# Patient Record
Sex: Female | Born: 1985 | Race: White | Hispanic: No | Marital: Married | Smoking: Former smoker
Health system: Southern US, Community
[De-identification: ages and names within clinical notes are randomized; demographics above are authoritative.]

## PROBLEM LIST (undated history)

## (undated) DIAGNOSIS — F909 Attention-deficit hyperactivity disorder, unspecified type: Secondary | ICD-10-CM

## (undated) DIAGNOSIS — E039 Hypothyroidism, unspecified: Secondary | ICD-10-CM

## (undated) DIAGNOSIS — F410 Panic disorder [episodic paroxysmal anxiety] without agoraphobia: Secondary | ICD-10-CM

## (undated) DIAGNOSIS — S0300XA Dislocation of jaw, unspecified side, initial encounter: Secondary | ICD-10-CM

## (undated) DIAGNOSIS — T783XXA Angioneurotic edema, initial encounter: Secondary | ICD-10-CM

## (undated) DIAGNOSIS — L509 Urticaria, unspecified: Secondary | ICD-10-CM

## (undated) HISTORY — DX: Angioneurotic edema, initial encounter: T78.3XXA

## (undated) HISTORY — DX: Urticaria, unspecified: L50.9

## (undated) HISTORY — PX: APPENDECTOMY: SHX54

---
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
## 2013-03-17 ENCOUNTER — Encounter (HOSPITAL_COMMUNITY): Payer: Self-pay | Admitting: Pharmacy Technician

## 2013-03-17 NOTE — H&P (Signed)
NTS SOAP Note  Vital Signs:  Vitals as of: 03/17/2013: Systolic 109: Diastolic 77: Heart Rate 73: Temp 68F: Height 64ft 11in: Weight 162Lbs 0 Ounces: Pain Level 5: BMI 22.59  BMI : 22.59 kg/m2  Subjective: This 19 Years 70 Months old Female presents for of    ABDOMINAL ISSUES: ,Has had worsening upper abdominal pain, nausea, and bloating for the past four months.  Has adjusted diet, tried probiotics, but it seems to be getting worse.  Has both diarrhea and constipation.  No h/o IBS.  Mother and sister both have had their gallbladders removed.  U/S of gallbladder negative.  HIDA scan shows 44% ejection fraction, but reproducible symptoms with fatty meal.  No fever, chills, jaundice.   Review of Symptoms:  Constitutional:unremarkable   Head:unremarkable    Eyes:unremarkable   Nose/Mouth/Throat:unremarkable Cardiovascular:  unremarkable   Respiratory:unremarkable   Gastrointestin    abdominal pain,nausea,vomiting,diarrhea,constipation,excessive gas Genitourinary:unremarkable     Musculoskeletal:unremarkable   Skin:unremarkable Hematolgic/Lymphatic:unremarkable     Allergic/Immunologic:unremarkable     Past Medical History:    Reviewed   Past Medical History  Surgical History: appy, childbirth Medical Problems: none Allergies: nkda Medications: iud   Social History:Reviewed  Social History  Preferred Language: English Race:  White Ethnicity: Not Hispanic / Latino Age: 27 Years 6 Months Marital Status:  S Alcohol:  No Recreational drug(s):  No   Smoking Status: Never smoker reviewed on 03/17/2013 Functional Status reviewed on mm/dd/yyyy ------------------------------------------------ Bathing: Normal Cooking: Normal Dressing: Normal Driving: Normal Eating: Normal Managing Meds: Normal Oral Care: Normal Shopping: Normal Toileting: Normal Transferring: Normal Walking: Normal Cognitive Status reviewed on  mm/dd/yyyy ------------------------------------------------ Attention: Normal Decision Making: Normal Language: Normal Memory: Normal Motor: Normal Perception: Normal Problem Solving: Normal Visual and Spatial: Normal   Family History:  Reviewed  Family Health History Mother, Living; Healthy; healthy Father, Living; Heart attack (myocardial infarction);     Objective Information: General:  Well appearing, well nourished in no distress.   no scleral icterus Heart:  RRR, no murmur or gallop.  Normal S1, S2.  No S3, S4.  Lungs:    CTA bilaterally, no wheezes, rhonchi, rales.  Breathing unlabored. Abdomen:Soft, tender in right upper quadrant to palpation, bloated, no HSM, no masses.  Assessment:Chronic cholecystitis  Diagnosis &amp; Procedure Smart Code   Plan:Scheduled for laparoscopic cholecystectomy on 03/23/13.   Patient Education:Alternative treatments to surgery were discussed with patient (and family).  Risks and benefits  of procedure including bleeding, infection, hepatobiilary injury, and the possibility of an open procedure, and the possibility of ongoing abdominal symptoms were fully explained to the patient (and family) who gave informed consent. Patient/family questions were addressed.  Follow-up:Pending Surgery

## 2013-03-21 ENCOUNTER — Encounter (HOSPITAL_COMMUNITY)
Admission: RE | Admit: 2013-03-21 | Discharge: 2013-03-21 | Disposition: A | Discharge status: Home / Self Care | Payer: PRIVATE HEALTH INSURANCE | Source: Ambulatory Visit | Attending: General Surgery | Admitting: General Surgery

## 2013-03-21 ENCOUNTER — Encounter (HOSPITAL_COMMUNITY): Payer: Self-pay

## 2013-03-21 HISTORY — DX: Attention-deficit hyperactivity disorder, unspecified type: F90.9

## 2013-03-21 HISTORY — DX: Panic disorder (episodic paroxysmal anxiety): F41.0

## 2013-03-21 HISTORY — DX: Dislocation of jaw, unspecified side, initial encounter: S03.00XA

## 2013-03-21 LAB — CBC WITH DIFFERENTIAL/PLATELET
Basophils Absolute: 0 10*3/uL (ref 0.0–0.1)
Lymphocytes Relative: 32 % (ref 12–46)
Lymphs Abs: 1.7 10*3/uL (ref 0.7–4.0)
Neutro Abs: 3 10*3/uL (ref 1.7–7.7)
Neutrophils Relative %: 57 % (ref 43–77)
Platelets: 157 10*3/uL (ref 150–400)
RBC: 4.37 MIL/uL (ref 3.87–5.11)
RDW: 12.3 % (ref 11.5–15.5)
WBC: 5.4 10*3/uL (ref 4.0–10.5)

## 2013-03-21 LAB — HEPATIC FUNCTION PANEL
AST: 13 U/L (ref 0–37)
Albumin: 4.2 g/dL (ref 3.5–5.2)
Total Bilirubin: 0.6 mg/dL (ref 0.3–1.2)

## 2013-03-21 LAB — BASIC METABOLIC PANEL
BUN: 9 mg/dL (ref 6–23)
CO2: 29 mEq/L (ref 19–32)
Chloride: 103 mEq/L (ref 96–112)
GFR calc non Af Amer: >90 mL/min (ref >90)
Glucose, Bld: 63 mg/dL — ABNORMAL LOW (ref 70–99)
Potassium: 3.9 mEq/L (ref 3.5–5.1)

## 2013-03-21 NOTE — Patient Instructions (Addendum)
CAMESHIA CRESSMAN  03/21/2013   Your procedure is scheduled on:  03/23/2013  Report to [facility] at  830  AM.  Call this number if you have problems the morning of surgery: [phone]   Remember:   Do not eat food or drink liquids after midnight.   Take these medicines the morning of surgery with A SIP OF WATER: none   Do not wear jewelry, make-up or nail polish.  Do not wear lotions, powders, or perfumes.  Do not shave 48 hours prior to surgery. Men may shave face and neck.  Do not bring valuables to the hospital.  [facility] is not responsible  for any belongings or valuables.  Contacts, dentures or bridgework may not be worn into surgery.  Leave suitcase in the car. After surgery it may be brought to your room.  For patients admitted to the hospital, checkout time is 11:00 AM the day of discharge.   Patients discharged the day of surgery will not be allowed to drive home.  Name and phone number of your driver: family  Special Instructions: Shower using CHG 2 nights before surgery and the night before surgery.  If you shower the day of surgery use CHG.  Use special wash - you have one bottle of CHG for all showers.  You should use approximately 1/3 of the bottle for each shower.   Please read over the following fact sheets that you were given: Pain Booklet, Coughing and Deep Breathing, Surgical Site Infection Prevention, Anesthesia Post-op Instructions and Care and Recovery After Surgery Laparoscopic Cholecystectomy Laparoscopic cholecystectomy is surgery to remove the gallbladder. The gallbladder is located slightly to the right of center in the abdomen, behind the liver. It is a concentrating and storage sac for the bile produced in the liver. Bile aids in the digestion and absorption of fats. Gallbladder disease (cholecystitis) is an inflammation of your gallbladder. This condition is usually caused by a buildup of gallstones (cholelithiasis) in your gallbladder. Gallstones can  block the flow of bile, resulting in inflammation and pain. In severe cases, emergency surgery may be required. When emergency surgery is not required, you will have time to prepare for the procedure. Laparoscopic surgery is an alternative to open surgery. Laparoscopic surgery usually has a shorter recovery time. Your common bile duct may also need to be examined and explored. Your caregiver will discuss this with you if he or she feels this should be done. If stones are found in the common bile duct, they may be removed. LET YOUR CAREGIVER KNOW ABOUT:  Allergies to food or medicine.  Medicines taken, including vitamins, herbs, eyedrops, over-the-counter medicines, and creams.  Use of steroids (by mouth or creams).  Previous problems with anesthetics or numbing medicines.  History of bleeding problems or blood clots.  Previous surgery.  Other health problems, including diabetes and kidney problems.  Possibility of pregnancy, if this applies. RISKS AND COMPLICATIONS All surgery is associated with risks. Some problems that may occur following this procedure include:  Infection.  Damage to the common bile duct, nerves, arteries, veins, or other internal organs such as the stomach or intestines.  Bleeding.  A stone may remain in the common bile duct. BEFORE THE PROCEDURE  Do not take aspirin for 3 days prior to surgery or blood thinners for 1 week prior to surgery.  Do not eat or drink anything after midnight the night before surgery.  Let your caregiver know if you develop a cold  or other infectious problem prior to surgery.  You should be present 60 minutes before the procedure or as directed. PROCEDURE  You will be given medicine that makes you sleep (general anesthetic). When you are asleep, your surgeon will make several small cuts (incisions) in your abdomen. One of these incisions is used to insert a small, lighted scope (laparoscope) into the abdomen. The laparoscope helps  the surgeon see into your abdomen. Carbon dioxide gas will be pumped into your abdomen. The gas allows more room for the surgeon to perform your surgery. Other operating instruments are inserted through the other incisions. Laparoscopic procedures may not be appropriate when:  There is major scarring from previous surgery.  The gallbladder is extremely inflamed.  There are bleeding disorders or unexpected cirrhosis of the liver.  A pregnancy is near term.  Other conditions make the laparoscopic procedure impossible. If your surgeon feels it is not safe to continue with a laparoscopic procedure, he or she will perform an open abdominal procedure. In this case, the surgeon will make an incision to open the abdomen. This gives the surgeon a larger view and field to work within. This may allow the surgeon to perform procedures that sometimes cannot be performed with a laparoscope alone. Open surgery has a longer recovery time. AFTER THE PROCEDURE  You will be taken to the recovery area where a nurse will watch and check your progress.  You may be allowed to go home the same day.  Do not resume physical activities until directed by your caregiver.  You may resume a normal diet and activities as directed. Document Released: 07/14/2005 Document Revised: 10/06/2011 Document Reviewed: 12/27/2010 [facility] Patient Information 2014 [place], [place]. PATIENT INSTRUCTIONS POST-ANESTHESIA  IMMEDIATELY FOLLOWING SURGERY:  Do not drive or operate machinery for the first twenty four hours after surgery.  Do not make any important decisions for twenty four hours after surgery or while taking narcotic pain medications or sedatives.  If you develop intractable nausea and vomiting or a severe headache please notify your doctor immediately.  FOLLOW-UP:  Please make an appointment with your surgeon as instructed. You do not need to follow up with anesthesia unless specifically instructed to do so.  WOUND CARE  INSTRUCTIONS (if applicable):  Keep a dry clean dressing on the anesthesia/puncture wound site if there is drainage.  Once the wound has quit draining you may leave it open to air.  Generally you should leave the bandage intact for twenty four hours unless there is drainage.  If the epidural site drains for more than 36-48 hours please call the anesthesia department.  QUESTIONS?:  Please feel free to call your physician or the hospital operator if you have any questions, and they will be happy to assist you.

## 2013-03-23 ENCOUNTER — Encounter (HOSPITAL_COMMUNITY)
Admission: RE | Disposition: A | Discharge status: Home / Self Care | Payer: Self-pay | Source: Ambulatory Visit | Attending: General Surgery

## 2013-03-23 ENCOUNTER — Encounter (HOSPITAL_COMMUNITY): Payer: Self-pay | Admitting: Anesthesiology

## 2013-03-23 ENCOUNTER — Ambulatory Visit (HOSPITAL_COMMUNITY)
Admission: RE | Admit: 2013-03-23 | Discharge: 2013-03-23 | Disposition: A | Discharge status: Home / Self Care | Payer: PRIVATE HEALTH INSURANCE | Source: Ambulatory Visit | Attending: General Surgery | Admitting: General Surgery

## 2013-03-23 ENCOUNTER — Encounter (HOSPITAL_COMMUNITY): Payer: Self-pay | Admitting: *Deleted

## 2013-03-23 ENCOUNTER — Ambulatory Visit (HOSPITAL_COMMUNITY): Payer: PRIVATE HEALTH INSURANCE | Admitting: Anesthesiology

## 2013-03-23 DIAGNOSIS — K811 Chronic cholecystitis: Secondary | ICD-10-CM | POA: Insufficient documentation

## 2013-03-23 HISTORY — PX: CHOLECYSTECTOMY: SHX55

## 2013-03-23 SURGERY — LAPAROSCOPIC CHOLECYSTECTOMY
Anesthesia: General | Site: Abdomen | Wound class: Contaminated

## 2013-03-23 MED ORDER — MIDAZOLAM HCL 2 MG/2ML IJ SOLN
1.0000 mg | INTRAMUSCULAR | Status: DC | PRN
  Administered 2013-03-23 (×2): 2 mg via INTRAVENOUS

## 2013-03-23 MED ORDER — CHLORHEXIDINE GLUCONATE 4 % EX LIQD: 1.0000 "application " | Freq: Once | CUTANEOUS | Status: DC

## 2013-03-23 MED ORDER — LIDOCAINE HCL (PF) 1 % IJ SOLN
INTRAMUSCULAR | Status: AC
  Filled 2013-03-23: qty 5

## 2013-03-23 MED ORDER — FENTANYL CITRATE 0.05 MG/ML IJ SOLN
INTRAMUSCULAR | Status: DC | PRN
  Administered 2013-03-23: 50 ug via INTRAVENOUS
  Administered 2013-03-23 (×3): 100 ug via INTRAVENOUS

## 2013-03-23 MED ORDER — OXYCODONE-ACETAMINOPHEN 7.5-325 MG PO TABS: 1.0000 | ORAL_TABLET | ORAL | Status: AC | PRN

## 2013-03-23 MED ORDER — SODIUM CHLORIDE 0.9 % IR SOLN
Status: DC | PRN
  Administered 2013-03-23: 1000 mL

## 2013-03-23 MED ORDER — PROPOFOL 10 MG/ML IV EMUL
INTRAVENOUS | Status: AC
  Filled 2013-03-23: qty 20

## 2013-03-23 MED ORDER — FENTANYL CITRATE 0.05 MG/ML IJ SOLN
25.0000 ug | INTRAMUSCULAR | Status: DC | PRN
  Administered 2013-03-23 (×4): 50 ug via INTRAVENOUS

## 2013-03-23 MED ORDER — MIDAZOLAM HCL 2 MG/2ML IJ SOLN
INTRAMUSCULAR | Status: AC
  Filled 2013-03-23: qty 2

## 2013-03-23 MED ORDER — ONDANSETRON HCL 4 MG/2ML IJ SOLN
4.0000 mg | Freq: Once | INTRAMUSCULAR | Status: AC
  Administered 2013-03-23: 4 mg via INTRAVENOUS

## 2013-03-23 MED ORDER — NEOSTIGMINE METHYLSULFATE 1 MG/ML IJ SOLN
INTRAMUSCULAR | Status: DC | PRN
  Administered 2013-03-23: 3 mg via INTRAVENOUS

## 2013-03-23 MED ORDER — CEFAZOLIN SODIUM-DEXTROSE 2-3 GM-% IV SOLR
INTRAVENOUS | Status: AC
  Filled 2013-03-23: qty 50

## 2013-03-23 MED ORDER — HEMOSTATIC AGENTS (NO CHARGE) OPTIME
TOPICAL | Status: DC | PRN
  Administered 2013-03-23: 1 via TOPICAL

## 2013-03-23 MED ORDER — ONDANSETRON HCL 4 MG/2ML IJ SOLN
INTRAMUSCULAR | Status: DC | PRN
  Administered 2013-03-23: 4 mg via INTRAVENOUS

## 2013-03-23 MED ORDER — BUPIVACAINE HCL (PF) 0.5 % IJ SOLN
INTRAMUSCULAR | Status: DC | PRN
  Administered 2013-03-23: 9 mL

## 2013-03-23 MED ORDER — GLYCOPYRROLATE 0.2 MG/ML IJ SOLN
INTRAMUSCULAR | Status: DC | PRN
  Administered 2013-03-23: 0.4 mg via INTRAVENOUS

## 2013-03-23 MED ORDER — MIDAZOLAM HCL 5 MG/5ML IJ SOLN
INTRAMUSCULAR | Status: DC | PRN
  Administered 2013-03-23: 2 mg via INTRAVENOUS

## 2013-03-23 MED ORDER — LACTATED RINGERS IV SOLN
INTRAVENOUS | Status: DC
  Administered 2013-03-23: 1000 mL via INTRAVENOUS
  Administered 2013-03-23: 09:00:00 via INTRAVENOUS

## 2013-03-23 MED ORDER — FENTANYL CITRATE 0.05 MG/ML IJ SOLN
INTRAMUSCULAR | Status: AC
  Filled 2013-03-23: qty 2

## 2013-03-23 MED ORDER — ONDANSETRON HCL 4 MG/2ML IJ SOLN
INTRAMUSCULAR | Status: AC
  Filled 2013-03-23: qty 2

## 2013-03-23 MED ORDER — FENTANYL CITRATE 0.05 MG/ML IJ SOLN
INTRAMUSCULAR | Status: AC
  Filled 2013-03-23: qty 5

## 2013-03-23 MED ORDER — DEXAMETHASONE SODIUM PHOSPHATE 4 MG/ML IJ SOLN
4.0000 mg | Freq: Once | INTRAMUSCULAR | Status: AC
  Administered 2013-03-23: 4 mg via INTRAVENOUS

## 2013-03-23 MED ORDER — DEXAMETHASONE SODIUM PHOSPHATE 4 MG/ML IJ SOLN
INTRAMUSCULAR | Status: AC
  Filled 2013-03-23: qty 1

## 2013-03-23 MED ORDER — ONDANSETRON HCL 4 MG/2ML IJ SOLN: 4.0000 mg | Freq: Once | INTRAMUSCULAR | Status: DC | PRN

## 2013-03-23 MED ORDER — CEFAZOLIN SODIUM-DEXTROSE 2-3 GM-% IV SOLR
2.0000 g | INTRAVENOUS | Status: DC
  Administered 2013-03-23: 2 g via INTRAVENOUS

## 2013-03-23 MED ORDER — KETOROLAC TROMETHAMINE 30 MG/ML IJ SOLN
30.0000 mg | Freq: Once | INTRAMUSCULAR | Status: AC
  Administered 2013-03-23: 30 mg via INTRAVENOUS

## 2013-03-23 MED ORDER — KETOROLAC TROMETHAMINE 30 MG/ML IJ SOLN
INTRAMUSCULAR | Status: AC
  Filled 2013-03-23: qty 1

## 2013-03-23 MED ORDER — BUPIVACAINE HCL (PF) 0.5 % IJ SOLN
INTRAMUSCULAR | Status: AC
  Filled 2013-03-23: qty 30

## 2013-03-23 SURGICAL SUPPLY — 41 items
APPLIER CLIP LAPSCP 10X32 DD (CLIP) ×2 IMPLANT
BAG HAMPER (MISCELLANEOUS) ×2 IMPLANT
BAG SPEC RTRVL LRG 6X4 10 (ENDOMECHANICALS) ×1
CLOTH BEACON ORANGE TIMEOUT ST (SAFETY) ×2 IMPLANT
COVER LIGHT HANDLE STERIS (MISCELLANEOUS) ×4 IMPLANT
DECANTER SPIKE VIAL GLASS SM (MISCELLANEOUS) ×2 IMPLANT
DURAPREP 26ML APPLICATOR (WOUND CARE) ×2 IMPLANT
ELECT REM PT RETURN 9FT ADLT (ELECTROSURGICAL) ×2
ELECTRODE REM PT RTRN 9FT ADLT (ELECTROSURGICAL) ×1 IMPLANT
FILTER SMOKE EVAC LAPAROSHD (FILTER) ×2 IMPLANT
FORMALIN 10 PREFIL 120ML (MISCELLANEOUS) ×2 IMPLANT
GLOVE BIO SURGEON STRL SZ7.5 (GLOVE) ×2 IMPLANT
GLOVE BIOGEL PI IND STRL 7.0 (GLOVE) IMPLANT
GLOVE BIOGEL PI INDICATOR 7.0 (GLOVE) ×2
GLOVE ECLIPSE 6.5 STRL STRAW (GLOVE) ×2 IMPLANT
GLOVE EXAM NITRILE LRG STRL (GLOVE) ×1 IMPLANT
GLOVE SS BIOGEL STRL SZ 6.5 (GLOVE) IMPLANT
GLOVE SUPERSENSE BIOGEL SZ 6.5 (GLOVE) ×1
GOWN STRL REIN XL XLG (GOWN DISPOSABLE) ×6 IMPLANT
HEMOSTAT SNOW SURGICEL 2X4 (HEMOSTASIS) ×2 IMPLANT
INST SET LAPROSCOPIC AP (KITS) ×2 IMPLANT
KIT ROOM TURNOVER APOR (KITS) ×2 IMPLANT
MANIFOLD NEPTUNE II (INSTRUMENTS) ×2 IMPLANT
NDL INSUFFLATION 14GA 120MM (NEEDLE) ×1 IMPLANT
NEEDLE INSUFFLATION 14GA 120MM (NEEDLE) ×2 IMPLANT
NS IRRIG 1000ML POUR BTL (IV SOLUTION) ×2 IMPLANT
PACK LAP CHOLE LZT030E (CUSTOM PROCEDURE TRAY) ×2 IMPLANT
PAD ARMBOARD 7.5X6 YLW CONV (MISCELLANEOUS) ×2 IMPLANT
POUCH SPECIMEN RETRIEVAL 10MM (ENDOMECHANICALS) ×2 IMPLANT
SET BASIN LINEN APH (SET/KITS/TRAYS/PACK) ×2 IMPLANT
SLEEVE Z-THREAD 5X100MM (TROCAR) ×2 IMPLANT
SPONGE GAUZE 2X2 8PLY STRL LF (GAUZE/BANDAGES/DRESSINGS) ×8 IMPLANT
STAPLER VISISTAT (STAPLE) ×2 IMPLANT
SUT VICRYL 0 UR6 27IN ABS (SUTURE) ×2 IMPLANT
TAPE CLOTH SURG 4X10 WHT LF (GAUZE/BANDAGES/DRESSINGS) ×1 IMPLANT
TROCAR ENDO BLADELESS 11MM (ENDOMECHANICALS) ×2 IMPLANT
TROCAR XCEL UNIV SLVE 11M 100M (ENDOMECHANICALS) ×2 IMPLANT
TROCAR Z-THREAD FIOS 5X100MM (TROCAR) ×2 IMPLANT
TUBING INSUFFLATION (TUBING) ×2 IMPLANT
WARMER LAPAROSCOPE (MISCELLANEOUS) ×2 IMPLANT
YANKAUER SUCT 12FT TUBE ARGYLE (SUCTIONS) ×2 IMPLANT

## 2013-03-23 NOTE — Transfer of Care (Signed)
Immediate Anesthesia Transfer of Care Note  Patient: Kathryn Walker  Procedure(s) Performed: Procedure(s): LAPAROSCOPIC CHOLECYSTECTOMY (N/A)  Patient Location: PACU  Anesthesia Type:General  Level of Consciousness: awake and patient cooperative  Airway & Oxygen Therapy: Patient Spontanous Breathing and Patient connected to face mask oxygen  Post-op Assessment: Report given to PACU RN, Post -op Vital signs reviewed and stable and Patient moving all extremities  Post vital signs: Reviewed and stable  Complications: No apparent anesthesia complications

## 2013-03-23 NOTE — Progress Notes (Signed)
Awake. Continues moaning/groaning. C/O postop abd pain. Restless. Med as noted.

## 2013-03-23 NOTE — Progress Notes (Signed)
Rates pain 2. Wants to see Romeo Apple, her boyfriend. Wants to go home.

## 2013-03-23 NOTE — Interval H&P Note (Signed)
History and Physical Interval Note:  03/23/2013 9:26 AM  Kathryn Walker  has presented today for surgery, with the diagnosis of chronic cholycistitis  The various methods of treatment have been discussed with the patient and family. After consideration of risks, benefits and other options for treatment, the patient has consented to  Procedure(s): LAPAROSCOPIC CHOLECYSTECTOMY (N/A) as a surgical intervention .  The patient's history has been reviewed, patient examined, no change in status, stable for surgery.  I have reviewed the patient's chart and labs.  Questions were answered to the patient's satisfaction.     Franky Macho A

## 2013-03-23 NOTE — Anesthesia Preprocedure Evaluation (Signed)
Anesthesia Evaluation  Patient identified by MRN, date of birth, ID band Patient awake    Reviewed: Allergy & Precautions, H&P , NPO status , Patient's Chart, lab work & pertinent test results  Airway Mallampati: I TM Distance: >3 FB     Dental  (+) Teeth Intact   Pulmonary neg pulmonary ROS,          Cardiovascular negative cardio ROS  Rhythm:Regular Rate:Normal     Neuro/Psych PSYCHIATRIC DISORDERS (panic attacks) Anxiety    GI/Hepatic negative GI ROS,   Endo/Other    Renal/GU      Musculoskeletal   Abdominal   Peds  Hematology   Anesthesia Other Findings   Reproductive/Obstetrics                           Anesthesia Physical Anesthesia Plan  ASA: II  Anesthesia Plan: General   Post-op Pain Management:    Induction: Intravenous  Airway Management Planned: Oral ETT  Additional Equipment:   Intra-op Plan:   Post-operative Plan: Extubation in OR  Informed Consent: I have reviewed the patients History and Physical, chart, labs and discussed the procedure including the risks, benefits and alternatives for the proposed anesthesia with the patient or authorized representative who has indicated his/her understanding and acceptance.     Plan Discussed with:   Anesthesia Plan Comments:         Anesthesia Quick Evaluation

## 2013-03-23 NOTE — Op Note (Signed)
Patient:  Kathryn Walker  DOB:  1986/06/14  MRN:  [id number]   Preop Diagnosis:  Chronic cholecystitis  Postop Diagnosis:  Same  Procedure:  Laparoscopic cholecystectomy  Surgeon:  Franky Macho, M.D.  Anes:  General endotracheal  Indications:  Patient is a 27 year old white female presents with biliary colic secondary to chronic cholecystitis. Risks and benefits of the procedure including bleeding, infection, hepatobiliary injury, and the possibility of an open procedure were fully explained to the patient, who gave informed consent.  Procedure note:  The patient is placed the supine position. After induction of general endotracheal anesthesia, the abdomen was prepped and draped using usual sterile technique with DuraPrep. Surgical site confirmation was performed.  An infraumbilical incision was made down to the fascia. A Veress needle was introduced into the abdominal cavity and confirmation of placement was done using the saline drop test. The abdomen was then insufflated to 16 mm mercury pressure. An 11 mm trocar was introduced into the abdominal cavity under direct visualization without difficulty. The patient was placed in reverse Trendelenburg position and additional 11 mm trocar was placed the epigastric region and 5 mm trochars were placed in the right upper quadrant and right flank regions. The liver was inspected and noted within normal limits. The gallbladder was retracted in a dynamic fashion in order to expose the triangle of Calot. The cystic duct was first identified. Its juncture to the infundibulum was fully identified. Endoclips placed proximally and distally on the cystic duct, and the cystic duct was divided. This was likewise done to the cystic artery. The gallbladder was then freed away from the gallbladder fossa using Bovie electrocautery. The gallbladder was delivered through the epigastric trocar site using an Endo Catch bag. The gallbladder was sent to pathology further  examination. The gallbladder fossa was inspected and no abnormal bleeding or bile leakage was noted. Surgicel is placed the gallbladder fossa. All fluid and air were then evacuated from the abdominal cavity prior to removal of the trochars.  All wounds were irrigated normal saline. All wounds were injected with 0.5% Sensorcaine. The umbilical fascia as well as epigastric fascia were reapproximated using 0 Vicryl interrupted sutures. All skin incisions were closed using staples. Betadine ointment and dressed a dressings were applied.  All tape and needle counts were correct at the end of the procedure. Patient was extubated in the operating room and transferred to PACU in stable condition.  Complications:  None  EBL:  Minimal  Specimen:  Gallbladder

## 2013-03-23 NOTE — Anesthesia Procedure Notes (Signed)
Procedure Name: Intubation Date/Time: 03/23/2013 10:29 AM Performed by: Despina Hidden Pre-anesthesia Checklist: Emergency Drugs available, Suction available, Patient being monitored and Patient identified Patient Re-evaluated:Patient Re-evaluated prior to inductionOxygen Delivery Method: Circle system utilized Preoxygenation: Pre-oxygenation with 100% oxygen Ventilation: Mask ventilation without difficulty Laryngoscope Size: 3 and Mac Grade View: Grade I Tube type: Oral Tube size: 7.0 mm Number of attempts: 1 Airway Equipment and Method: Stylet Placement Confirmation: ETT inserted through vocal cords under direct vision,  positive ETCO2 and breath sounds checked- equal and bilateral Secured at: 22 cm Tube secured with: Tape Dental Injury: Teeth and Oropharynx as per pre-operative assessment

## 2013-03-23 NOTE — Anesthesia Postprocedure Evaluation (Signed)
  Anesthesia Post-op Note  Patient: Kathryn Walker  Procedure(s) Performed: Procedure(s): LAPAROSCOPIC CHOLECYSTECTOMY (N/A)  Patient Location: PACU  Anesthesia Type:General  Level of Consciousness: awake, alert , oriented and patient cooperative  Airway and Oxygen Therapy: Patient Spontanous Breathing  Post-op Pain: 3 /10, mild  Post-op Assessment: Post-op Vital signs reviewed, Patient's Cardiovascular Status Stable, Respiratory Function Stable, Patent Airway and Pain level controlled  Post-op Vital Signs: Reviewed and stable  Complications: No apparent anesthesia complications

## 2013-03-25 ENCOUNTER — Encounter (HOSPITAL_COMMUNITY): Payer: Self-pay | Admitting: General Surgery

## 2013-03-25 MED ORDER — LIDOCAINE HCL 1 % IJ SOLN
INTRAMUSCULAR | Status: DC | PRN
  Administered 2013-03-23: 50 mg via INTRADERMAL

## 2013-03-25 MED ORDER — ROCURONIUM BROMIDE 100 MG/10ML IV SOLN
INTRAVENOUS | Status: DC | PRN
  Administered 2013-03-23: 35 mg via INTRAVENOUS

## 2013-03-25 MED ORDER — PROPOFOL 10 MG/ML IV BOLUS
INTRAVENOUS | Status: DC | PRN
  Administered 2013-03-23: 160 mg via INTRAVENOUS

## 2015-05-30 ENCOUNTER — Encounter: Payer: BC Managed Care – PPO | Admitting: Neurology

## 2015-07-09 ENCOUNTER — Ambulatory Visit (INDEPENDENT_AMBULATORY_CARE_PROVIDER_SITE_OTHER): Payer: BC Managed Care – PPO | Admitting: Neurology

## 2015-07-09 ENCOUNTER — Ambulatory Visit (INDEPENDENT_AMBULATORY_CARE_PROVIDER_SITE_OTHER): Payer: Self-pay | Admitting: Neurology

## 2015-07-09 ENCOUNTER — Encounter: Payer: Self-pay | Admitting: Neurology

## 2015-07-09 DIAGNOSIS — M79641 Pain in right hand: Secondary | ICD-10-CM

## 2015-07-09 NOTE — Procedures (Signed)
     HISTORY:  Kathryn SnellHeather Walker is a 29 year old patient with a history of right hand discomfort and cold sensations associated with some numbness as well that began in August 2016. 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 The patient has been dropping things from the right hand on occasion. She denies any neck pain or pain down the right arm. She is being evaluated for possible carpal tunnel syndrome.  NERVE CONDUCTION STUDIES:  Nerve conduction studies were performed on both upper extremities. The distal motor latencies and motor amplitudes for the median and ulnar nerves were within normal limits. The F wave latencies and nerve conduction velocities for these nerves were also normal. The sensory latencies for the median and ulnar nerves were normal.   EMG STUDIES:  EMG study was performed on the right upper extremity:  The first dorsal interosseous muscle reveals 2 to 4 K units with full recruitment. No fibrillations or positive waves were noted. The abductor pollicis brevis muscle reveals 2 to 4 K units with full recruitment. No fibrillations or positive waves were noted. The extensor indicis proprius muscle reveals 1 to 3 K units with full recruitment. No fibrillations or positive waves were noted. The pronator teres muscle reveals 2 to 3 K units with full recruitment. No fibrillations or positive waves were noted. The biceps muscle reveals 1 to 2 K units with full recruitment. No fibrillations or positive waves were noted. The triceps muscle reveals 2 to 4 K units with full recruitment. No fibrillations or positive waves were noted. The anterior deltoid muscle reveals 2 to 3 K units with full recruitment. No fibrillations or positive waves were noted. The cervical paraspinal muscles were tested at 2 levels. No abnormalities of insertional activity were seen at either level tested. There was good relaxation.    IMPRESSION:  Nerve  conduction studies done on both upper extremities were within normal limits. No evidence of carpal tunnel syndrome is seen. EMG evaluation of the right upper extremity was unremarkable, without evidence of an overlying cervical radiculopathy.  Marlan Palau. Keith Teresina Bugaj MD 07/09/2015 10:57 AM  [facility] [address] [place], [place] [id number]  Phone [phone] Fax [phone]

## 2015-07-09 NOTE — Progress Notes (Signed)
Please refer to EMG and nerve conduction study procedure note. 

## 2017-06-23 DIAGNOSIS — Z369 Encounter for antenatal screening, unspecified: Secondary | ICD-10-CM | POA: Insufficient documentation

## 2017-10-05 DIAGNOSIS — O26893 Other specified pregnancy related conditions, third trimester: Secondary | ICD-10-CM | POA: Insufficient documentation

## 2019-05-13 ENCOUNTER — Other Ambulatory Visit: Payer: Self-pay

## 2019-05-13 DIAGNOSIS — Z20822 Contact with and (suspected) exposure to covid-19: Secondary | ICD-10-CM

## 2019-05-14 LAB — NOVEL CORONAVIRUS, NAA: SARS-CoV-2, NAA: NOT DETECTED

## 2019-05-20 ENCOUNTER — Other Ambulatory Visit: Payer: Self-pay

## 2019-05-20 DIAGNOSIS — Z20822 Contact with and (suspected) exposure to covid-19: Secondary | ICD-10-CM

## 2019-05-21 ENCOUNTER — Telehealth: Payer: Self-pay

## 2019-05-21 NOTE — Telephone Encounter (Signed)
Pt called for covid results and pt informed results are not back.

## 2019-05-22 LAB — NOVEL CORONAVIRUS, NAA: SARS-CoV-2, NAA: NOT DETECTED

## 2021-08-14 ENCOUNTER — Other Ambulatory Visit: Payer: Self-pay | Admitting: Nurse Practitioner

## 2021-08-14 DIAGNOSIS — Z803 Family history of malignant neoplasm of breast: Secondary | ICD-10-CM

## 2021-08-14 DIAGNOSIS — Z9189 Other specified personal risk factors, not elsewhere classified: Secondary | ICD-10-CM

## 2021-08-27 ENCOUNTER — Ambulatory Visit
Admission: RE | Admit: 2021-08-27 | Discharge: 2021-08-27 | Disposition: A | Discharge status: Home / Self Care | Payer: BC Managed Care – PPO | Source: Ambulatory Visit | Attending: Nurse Practitioner | Admitting: Nurse Practitioner

## 2021-08-27 ENCOUNTER — Other Ambulatory Visit: Payer: Self-pay

## 2021-08-27 DIAGNOSIS — Z9189 Other specified personal risk factors, not elsewhere classified: Secondary | ICD-10-CM

## 2021-08-27 DIAGNOSIS — Z803 Family history of malignant neoplasm of breast: Secondary | ICD-10-CM

## 2021-08-27 MED ORDER — GADOBUTROL 1 MMOL/ML IV SOLN
8.0000 mL | Freq: Once | INTRAVENOUS | Status: AC | PRN
  Administered 2021-08-27: 8 mL via INTRAVENOUS

## 2022-09-30 IMAGING — MR MR BREAST BILAT WO/W CM
8 of 12 series · 33 of 48 positions shown · IV contrast (8 ML GADAVIST)
Comparison: Previous exam(s).

CLINICAL DATA: High risk screening.

Strong family history of breast cancer with breast cancer risk
calculated to be 26%. / 2 aunts's dx in their 30's/ pt states she is
asymptomatic
EXAM:
BILATERAL BREAST MRI WITH AND WITHOUT CONTRAST
TECHNIQUE: Multiplanar, multisequence MR images of both breasts were obtained
prior to and following the intravenous administration of 8 ml of
Gadavist

[Series 2: t2_tirm_tra ipat (a-p) · axial · 3.0mm · 0.70mm/px · 1 of 55 slices shown]
[im 1/55]
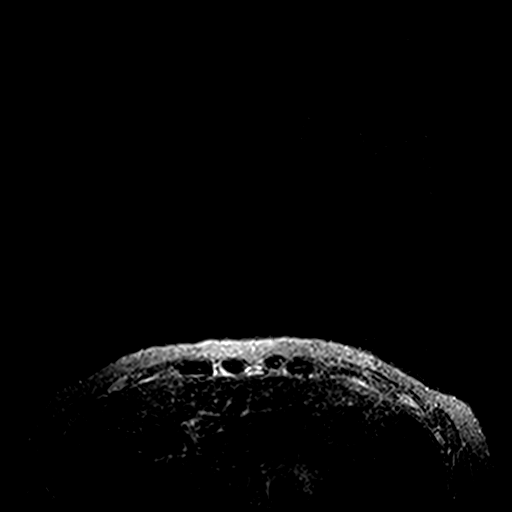

[Series 3: fl3d pre-cm no · axial · non-contrast · 1.2mm · 0.89mm/px · z∈[-69,+103]mm · 5 of 144 slices shown]
[im 1/144]
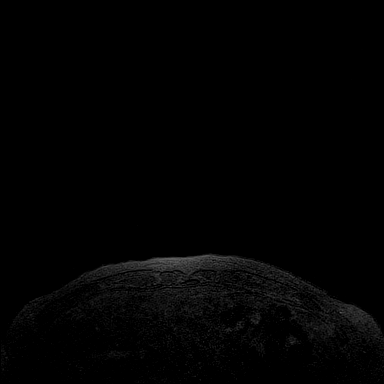
[im 36/144]
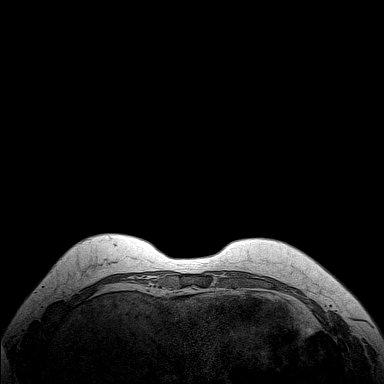
[im 72/144]
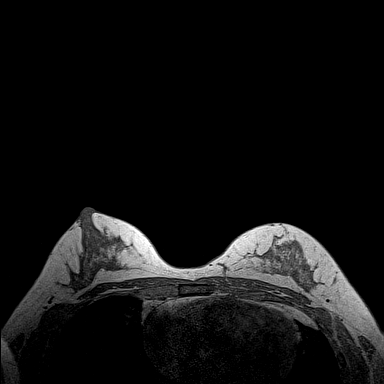
[im 108/144]
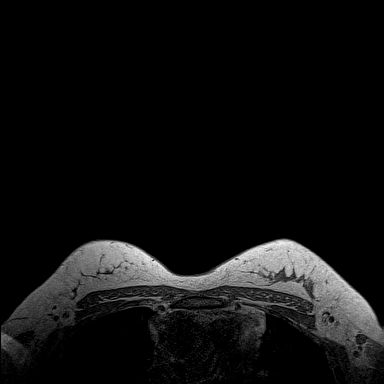
[im 144/144]
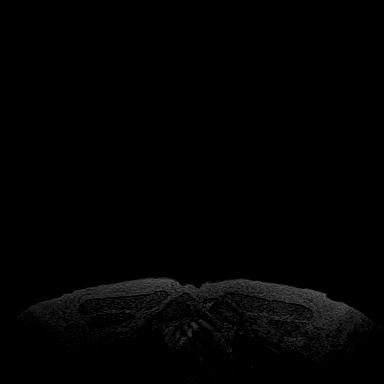

[Series 4: fl3d pre-cm · axial · non-contrast · 1.2mm · 0.89mm/px · z∈[-69,+103]mm · 5 of 144 slices shown]
[im 1/144]
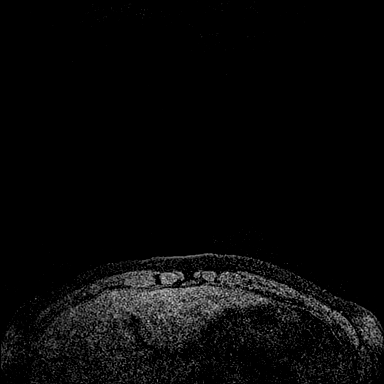
[im 36/144]
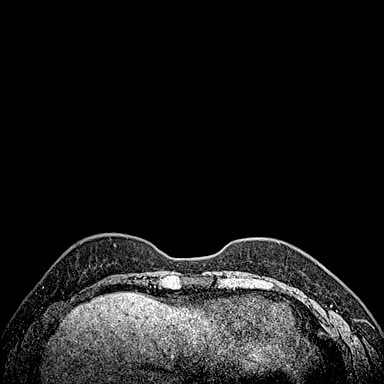
[im 72/144]
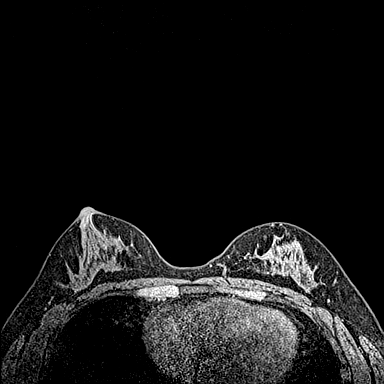
[im 108/144]
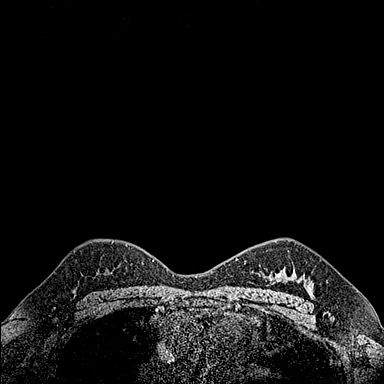
[im 144/144]
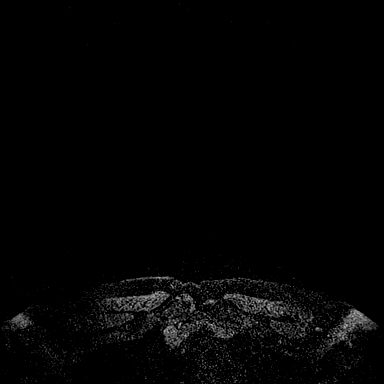

[Series 5: fl3d post-cm 20 · axial · 1.2mm · 0.89mm/px · z∈[-69,+103]mm · 5 of 144 slices shown (1 of 3)]
[im 1/144]
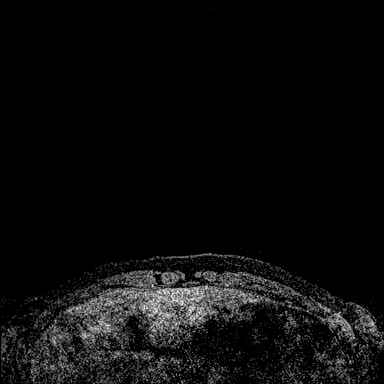
[im 36/144]
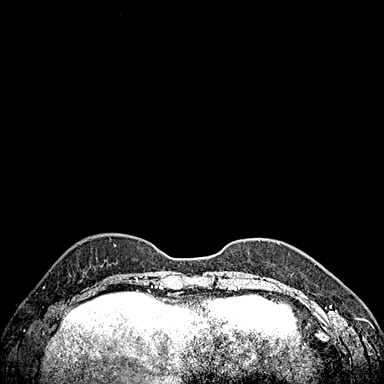
[im 72/144]
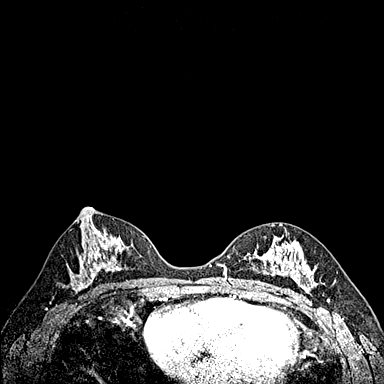
[im 108/144]
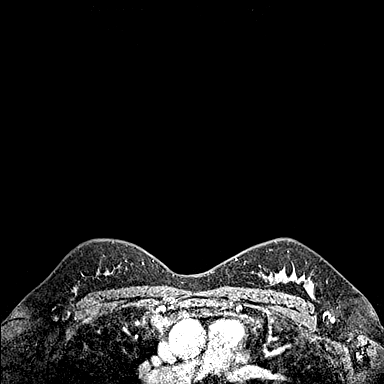
[im 144/144]
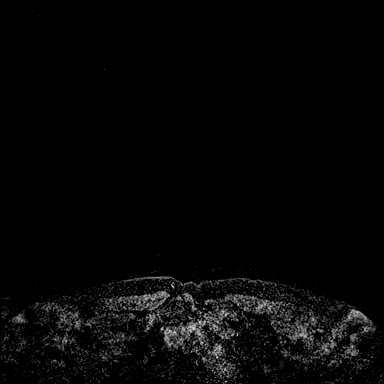

[Series 6: fl3d post-cm 20 · axial · 1.2mm · 0.89mm/px · z∈[-69,+103]mm · 5 of 144 slices shown (2 of 3)]
[im 1/144]
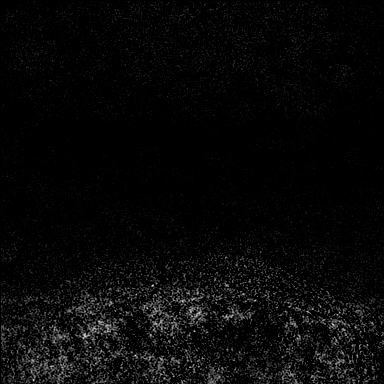
[im 36/144]
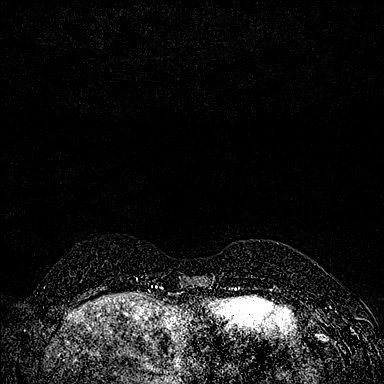
[im 72/144]
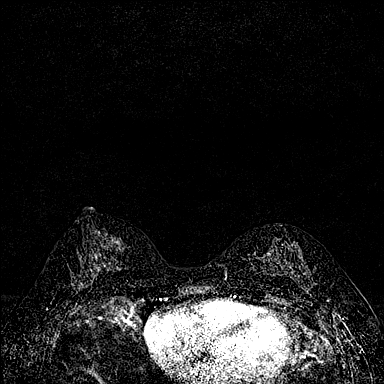
[im 108/144]
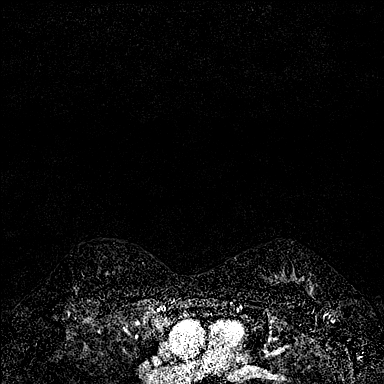
[im 144/144]
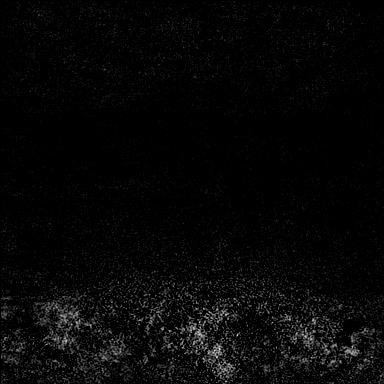

[Series 7: fl3d post-cm 20 · axial · 172.8mm · 0.89mm/px · 1 of 1 slices shown (3 of 3)]
[im 1/1]
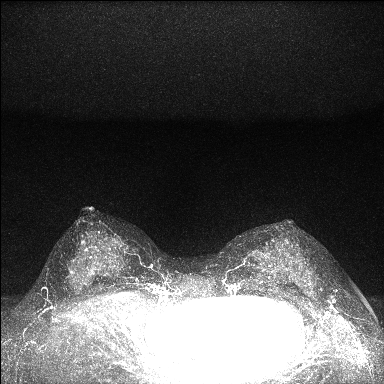

[Series 8: fl3d post-cm 3 · axial · 1.2mm · 0.89mm/px · z∈[-69,+103]mm · 6 of 144 slices shown (1 of 2)]
[im 1/144]
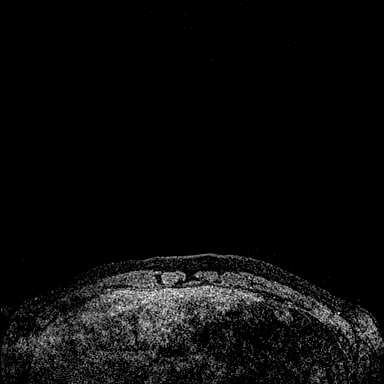
[im 29/144]
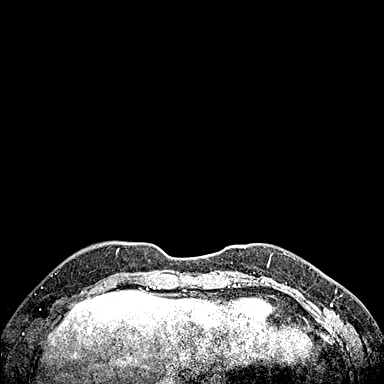
[im 58/144]
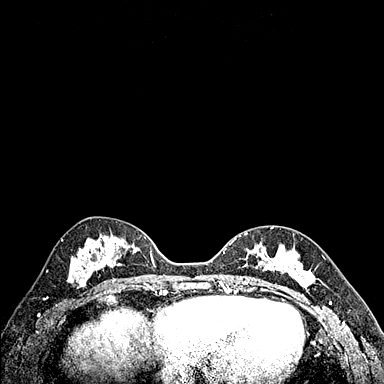
[im 86/144]
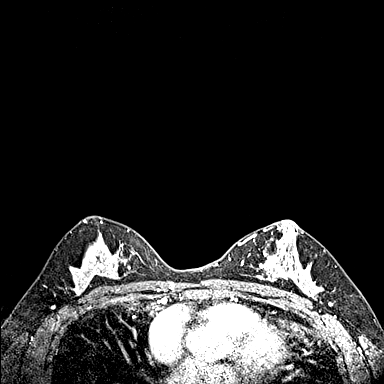
[im 115/144]
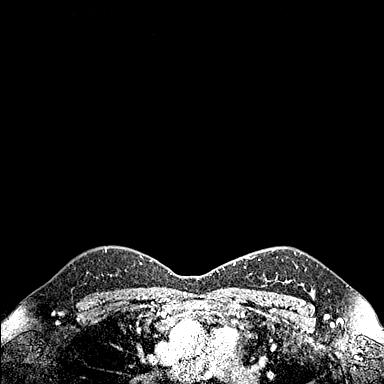
[im 144/144]
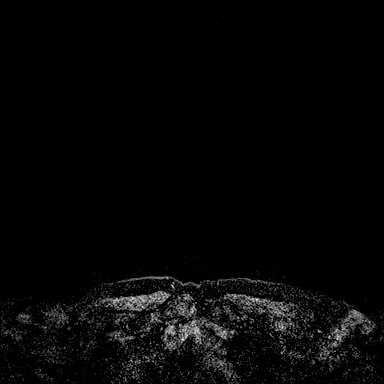

[Series 9: fl3d post-cm 3 · axial · 1.2mm · 0.89mm/px · z∈[-69,+68]mm · 5 of 144 slices shown (2 of 2)]
[im 1/144]
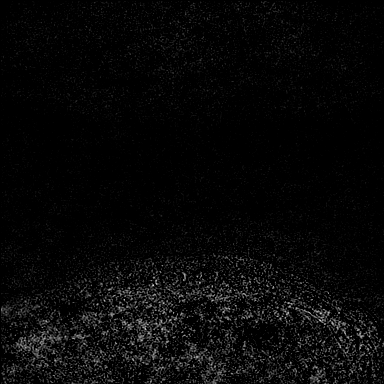
[im 29/144]
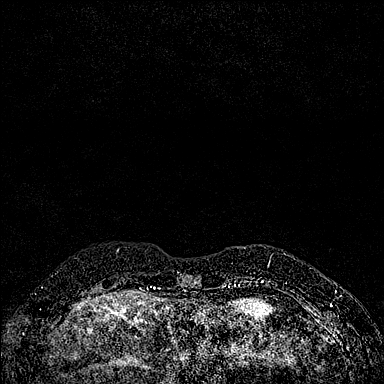
[im 58/144]
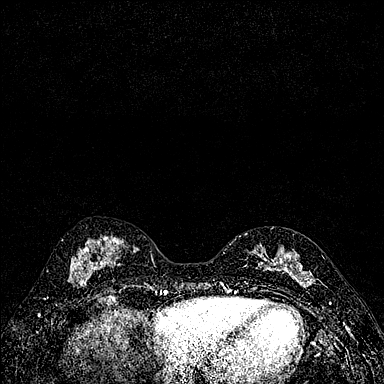
[im 86/144]
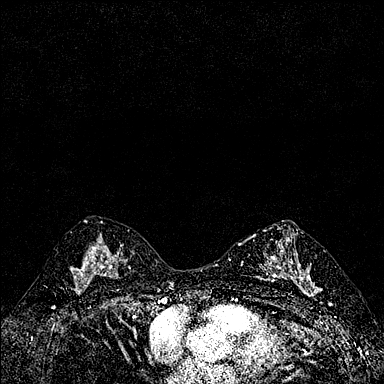
[im 115/144]
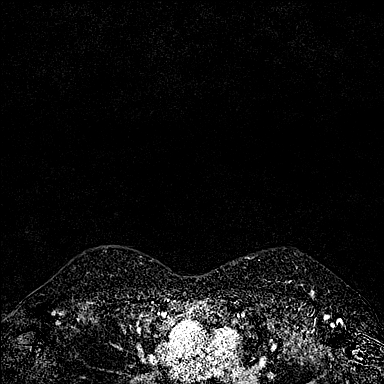

[33 of 48 positions shown; findings below may reference images not displayed]

Three-dimensional MR images were rendered by post-processing of the
original MR data on an independent workstation. The
three-dimensional MR images were interpreted, and findings are
reported in the following complete MRI report for this study. Three
dimensional images were evaluated at the independent interpreting
workstation using the DynaCAD thin client.
FINDINGS: Breast composition: c. Heterogeneous fibroglandular tissue.

Background parenchymal enhancement: Marked

Right breast: No mass or abnormal enhancement.

Left breast: No mass or abnormal enhancement.

Lymph nodes: No abnormal appearing lymph nodes.

Ancillary findings:  None.
IMPRESSION: No MRI evidence of breast carcinoma.

RECOMMENDATION:
1. Annual screening mammography.
2. Strong family history of breast carcinoma calculated lifetime
risk 26%. The American Cancer Society recommends annual supplemental
screening breast MRI, in addition to mammography, in patient's with
an estimated lifetime risk of developing breast carcinoma greater
than 20%, or who are known or suspected to be positive for the
breast cancer gene.

BI-RADS CATEGORY  1: Negative.

## 2023-01-28 ENCOUNTER — Ambulatory Visit: Payer: BC Managed Care – PPO | Admitting: Allergy & Immunology

## 2023-01-28 ENCOUNTER — Other Ambulatory Visit: Payer: Self-pay

## 2023-01-28 ENCOUNTER — Encounter: Payer: Self-pay | Admitting: Allergy & Immunology

## 2023-01-28 VITALS — BP 124/80 | HR 68 | Temp 98.2°F | Resp 18 | Ht 69.0 in | Wt 181.4 lb

## 2023-01-28 DIAGNOSIS — L508 Other urticaria: Secondary | ICD-10-CM | POA: Diagnosis not present

## 2023-01-28 NOTE — Progress Notes (Unsigned)
NEW PATIENT  Date of Service/Encounter:  01/28/23  Consult requested by: Selinda Flavin, MD   Assessment:   Acute urticaria - with non reactive skin testing today   Kindergarten teacher at [employer]  Plan/Recommendations:   1. Acute urticaria - Testing is negative to the entire environmental panel as well as most common foods. - This rules out greater than 95% of all food allergies. - Copy of testing results provided. - We are going to get some limited labs to rule out weird causes of hives. - I do not want to get too crazy and make your lab bill very high. - I do want to see you next time this happens. - Continue to note any triggers. - We will call you in 1 to 2 weeks with the results of the testing.  2. Return in about 6 months (around 07/31/2023). You can have the follow up appointment with Dr. Dellis Anes or a Nurse Practicioner (our Nurse Practitioners are excellent and always have Physician oversight!).    This note in its entirety was forwarded to the Provider who requested this consultation.  Subjective:   Kathryn Walker is a 37 y.o. female presenting today for evaluation of  Chief Complaint  Patient presents with   Urticaria    Not sure the cause - not currently using allergy medication    Allergic Reaction    [place] food causes her throat to close and lip swelling - she avoids it     Kathryn Walker has a history of the following: Patient Active Problem List   Diagnosis Date Noted   Right hand pain 07/09/2015    History obtained from: chart review and patient.  Kathryn Walker was referred by Selinda Flavin, MD.     Kathryn Walker is a 37 y.o. female presenting for an evaluation of t .  She developed hives over her arm in a couple of places. By Saturday, she had broken out over her entire body. This was Saturday April 20th. It spread to her neck and her chest. She never had fluid filled lesions. It was only red from where she touched her neck. She  was at recess when it first started. She had a yard sale on Saturday. She spend a lot of time outdoors as well. In total, the rash lasted for two weeks and then she went to the doctor finally. She got a course of prednisone to take and it all resolved. She had no other symptoms with this including stomach pain, throat irritation, or anything else.   She has reacted to [place] food 3 times in her life and she had throat irritation. This is only [place] food, but she does not do soy sauce. She does tolerate shellfish and seafood in other places. She stays away from soy and MSG.   She delayed coming in since she thought it would all get better.   Otherwise, there is no history of other atopic diseases, including asthma, drug allergies, stinging insect allergies, or contact dermatitis. There is no significant infectious history. Vaccinations are up to date.    Past Medical History: Patient Active Problem List   Diagnosis Date Noted   Right hand pain 07/09/2015    Medication List:  Allergies as of 01/28/2023   No Known Allergies      Medication List        Accurate as of January 28, 2023  4:32 PM. If you have any questions, ask your nurse or doctor.  atomoxetine 60 MG capsule Commonly known as: STRATTERA Take 60 mg by mouth daily.   escitalopram 20 MG tablet Commonly known as: LEXAPRO   Mirena (52 MG) 20 MCG/DAY Iud Generic drug: levonorgestrel TO BE INSERTED ONE TIME BY PRESCRIBER. ROUTE INTRAUTERINE.   multivitamin tablet Take 1 tablet by mouth daily.        Birth History: non-contributory  Developmental History: non-contributory  Past Surgical History: Past Surgical History:  Procedure Laterality Date   APPENDECTOMY     CHOLECYSTECTOMY N/A 03/23/2013   Procedure: LAPAROSCOPIC CHOLECYSTECTOMY;  Surgeon: Dalia Heading, MD;  Location: [facility];  Service: General;  Laterality: N/A;     Family History: History reviewed. No pertinent family  history.   Social History: Kathryn Walker lives at home with family.  They live in a house that is 94-year-old.  There is laminate throughout the home.  There is electric heating and central cooling.  There are [place] pigs inside the classroom.  She has a bunch of outdoor animals at home including chickens, horses, chickens, cats, and dogs.  There are no dust mite covers on the bedding.  There is no tobacco exposure.  She has been a Midwife for 5 years.  There is no fume, chemical, or dust exposure.  She does use a HEPA filter in her home.   Review of Systems  Constitutional: Negative.  Negative for chills, fever, malaise/fatigue and weight loss.  HENT: Negative.  Negative for congestion, ear discharge and ear pain.   Eyes:  Negative for pain, discharge and redness.  Respiratory:  Negative for cough, sputum production, shortness of breath and wheezing.   Cardiovascular: Negative.  Negative for chest pain and palpitations.  Gastrointestinal:  Negative for abdominal pain, constipation, diarrhea, heartburn, nausea and vomiting.  Skin: Negative.  Negative for itching and rash.  Neurological:  Negative for dizziness and headaches.  Endo/Heme/Allergies:  Negative for environmental allergies. Does not bruise/bleed easily.       Objective:   Blood pressure 124/80, pulse 68, temperature 98.2 F (36.8 C), resp. rate 18, height 5\' 9"  (1.753 m), weight 181 lb 6.4 oz (82.3 kg), SpO2 97 %. Body mass index is 26.79 kg/m.     Physical Exam Constitutional:      Appearance: She is well-developed.  HENT:     Head: Normocephalic and atraumatic.     Right Ear: Tympanic membrane, ear canal and external ear normal. No drainage, swelling or tenderness. Tympanic membrane is not injected, scarred, erythematous, retracted or bulging.     Left Ear: Tympanic membrane, ear canal and external ear normal. No drainage, swelling or tenderness. Tympanic membrane is not injected, scarred, erythematous,  retracted or bulging.     Nose: No nasal deformity, septal deviation, mucosal edema or rhinorrhea.     Right Sinus: No maxillary sinus tenderness or frontal sinus tenderness.     Left Sinus: No maxillary sinus tenderness or frontal sinus tenderness.     Mouth/Throat:     Mouth: Mucous membranes are not pale and not dry.     Pharynx: Uvula midline.  Eyes:     General:        Right eye: No discharge.        Left eye: No discharge.     Conjunctiva/sclera: Conjunctivae normal.     Right eye: Right conjunctiva is not injected. No chemosis.    Left eye: Left conjunctiva is not injected. No chemosis.    Pupils: Pupils are equal, round, and reactive to light.  Cardiovascular:     Rate and Rhythm: Normal rate and regular rhythm.     Heart sounds: Normal heart sounds.  Pulmonary:     Effort: Pulmonary effort is normal. No tachypnea, accessory muscle usage or respiratory distress.     Breath sounds: Normal breath sounds. No wheezing, rhonchi or rales.  Chest:     Chest wall: No tenderness.  Abdominal:     Tenderness: There is no abdominal tenderness. There is no guarding or rebound.  Lymphadenopathy:     Head:     Right side of head: No submandibular, tonsillar or occipital adenopathy.     Left side of head: No submandibular, tonsillar or occipital adenopathy.     Cervical: No cervical adenopathy.  Skin:    Coloration: Skin is not pale.     Findings: No abrasion, erythema, petechiae or rash. Rash is not papular, urticarial or vesicular.  Neurological:     Mental Status: She is alert.      Diagnostic studies:   Allergy Studies:     Airborne Adult Perc - 01/28/23 0915     Time Antigen Placed 0915    Allergen Manufacturer Waynette Buttery    Location Back    Number of Test 55    1. Control-Buffer 50% Glycerol Negative    2. Control-Histamine 3+    3. Bahia Negative    4. Mennella Southern Territories Negative    5. Johnson Negative    6. Kentucky Blue Negative    7. Meadow Fescue Negative    8. Perennial Rye  Negative    9. Timothy Negative    10. Ragweed Mix Negative    11. Cocklebur Negative    12. Plantain,  English Negative    13. Baccharis Negative    14. Dog Fennel Negative    15. Russian Thistle Negative    16. Lamb's Quarters Negative    17. Sheep Sorrell Negative    18. Rough Pigweed Negative    19. Marsh Elder, Rough Negative    20. Mugwort, Common Negative    21. Box, Elder Negative    22. Cedar, red Negative    23. Sweet Gum Negative    24. Pecan Pollen Negative    25. Pine Mix Negative    26. Walnut, Black Pollen Negative    27. Red Mulberry Negative    28. Ash Mix Negative    29. Birch Mix Negative    30. Beech American Negative    31. Cottonwood, Guinea-Bissau Negative    32. Hickory, White Negative    33. Maple Mix Negative    34. Oak, Guinea-[place] Mix Negative    35. Sycamore Eastern Negative    36. Alternaria Alternata Negative    37. Cladosporium Herbarum Negative    38. Aspergillus Mix Negative    39. Penicillium Mix Negative    40. Bipolaris Sorokiniana (Helminthosporium) Negative    41. Drechslera Spicifera (Curvularia) Negative    42. Mucor Plumbeus Negative    43. Fusarium Moniliforme Negative    44. Aureobasidium Pullulans (pullulara) Negative    45. Rhizopus Oryzae Negative    46. Botrytis Cinera Negative    47. Epicoccum Nigrum Negative    48. Phoma Betae Negative    49. Dust Mite Mix Negative    50. Cat Hair 10,000 BAU/ml Negative    51.  Dog Epithelia Negative    52. Mixed Feathers Negative    53. Horse Epithelia Negative    54. Cockroach, German Negative    55.  Tobacco Leaf Negative             Food Adult Perc - 01/28/23 0900     Time Antigen Placed 1610    Allergen Manufacturer Waynette Buttery    Location Back    Number of allergen test 17    1. Peanut Negative    2. Soybean Negative    3. Wheat Negative    4. Sesame Negative    5. Milk, Cow Negative    6. Casein Negative    7. Egg White, Chicken Negative    8. Shellfish Mix Negative    9. Fish  Mix Negative    10. Cashew Negative    11. Walnut Food Negative    12. Almond Negative    13. Hazelnut Negative    14. Pecan Food Negative    15. Pistachio Negative    16. [place] Nut Negative    17. Coconut Negative             Allergy testing results were read and interpreted by myself, documented by clinical staff.         Malachi Bonds, MD [facility]

## 2023-01-28 NOTE — Patient Instructions (Addendum)
1. Acute urticaria - Testing is negative to the entire environmental panel as well as most common foods. - This rules out greater than 95% of all food allergies. - Copy of testing results provided. - We are going to get some limited labs to rule out weird causes of hives. - I do not want to get too crazy and make your lab bill very high. - I do want to see you next time this happens. - Continue to note any triggers. - We will call you in 1 to 2 weeks with the results of the testing.  2. Return in about 6 months (around 07/31/2023). You can have the follow up appointment with Dr. Dellis Anes or a Nurse Practicioner (our Nurse Practitioners are excellent and always have Physician oversight!).    Please inform us of any Emergency Department visits, hospitalizations, or changes in symptoms. Call us before going to the ED for breathing or allergy symptoms since we might be able to fit you in for a sick visit. Feel free to contact us anytime with any questions, problems, or concerns.  It was a pleasure to meet you today!  Websites that have reliable patient information: 1. American Academy of Asthma, Allergy, and Immunology: www.aaaai.org 2. Food Allergy Research and Education (FARE): foodallergy.org 3. Mothers of Asthmatics: http://www.asthmacommunitynetwork.org 4. American College of Allergy, Asthma, and Immunology: www.acaai.org   COVID-19 Vaccine Information can be found at: [email or web address] For questions related to vaccine distribution or appointments, please email [email or web address] or call [phone].   We realize that you might be concerned about having an allergic reaction to the COVID19 vaccines. To help with that concern, WE ARE OFFERING THE COVID19 VACCINES IN OUR OFFICE! Ask the front desk for dates!     "Like" [place] on Facebook and Instagram for our latest updates!      A healthy democracy works best when [employer]  participate! Make sure you are registered to vote! If you have moved or changed any of your contact information, you will need to get this updated before voting!  In some cases, you MAY be able to register to vote online: [email or web address]       Airborne Adult Perc - 01/28/23 0915     Time Antigen Placed 0915    Allergen Manufacturer Waynette Buttery    Location Back    Number of Test 55    1. Control-Buffer 50% Glycerol Negative    2. Control-Histamine 3+    3. Bahia Negative    4. Mauch Southern Territories Negative    5. Johnson Negative    6. Kentucky Blue Negative    7. Meadow Fescue Negative    8. Perennial Rye Negative    9. Timothy Negative    10. Ragweed Mix Negative    11. Cocklebur Negative    12. Plantain,  English Negative    13. Baccharis Negative    14. Dog Fennel Negative    15. Russian Thistle Negative    16. Lamb's Quarters Negative    17. Sheep Sorrell Negative    18. Rough Pigweed Negative    19. Marsh Elder, Rough Negative    20. Mugwort, Common Negative    21. Box, Elder Negative    22. Cedar, red Negative    23. Sweet Gum Negative    24. Pecan Pollen Negative    25. Pine Mix Negative    26. Walnut, Black Pollen Negative    27. Red Mulberry Negative    28. Ash Mix Negative    29.  Birch Mix Negative    30. Beech American Negative    31. Cottonwood, Guinea-Bissau Negative    32. Hickory, White Negative    33. Maple Mix Negative    34. Oak, [place] Mix Negative    35. Sycamore Eastern Negative    36. Alternaria Alternata Negative    37. Cladosporium Herbarum Negative    38. Aspergillus Mix Negative    39. Penicillium Mix Negative    40. Bipolaris Sorokiniana (Helminthosporium) Negative    41. Drechslera Spicifera (Curvularia) Negative    42. Mucor Plumbeus Negative    43. Fusarium Moniliforme Negative    44. Aureobasidium Pullulans (pullulara) Negative    45. Rhizopus Oryzae Negative    46. Botrytis Cinera Negative    47. Epicoccum Nigrum  Negative    48. Phoma Betae Negative    49. Dust Mite Mix Negative    50. Cat Hair 10,000 BAU/ml Negative    51.  Dog Epithelia Negative    52. Mixed Feathers Negative    53. Horse Epithelia Negative    54. Cockroach, German Negative    55. Tobacco Leaf Negative             Food Adult Perc - 01/28/23 0900     Time Antigen Placed 1610    Allergen Manufacturer Waynette Buttery    Location Back    Number of allergen test 17    1. Peanut Negative    2. Soybean Negative    3. Wheat Negative    4. Sesame Negative    5. Milk, Cow Negative    6. Casein Negative    7. Egg White, Chicken Negative    8. Shellfish Mix Negative    9. Fish Mix Negative    10. Cashew Negative    11. Walnut Food Negative    12. Almond Negative    13. Hazelnut Negative    14. Pecan Food Negative    15. Pistachio Negative    16. [place] Nut Negative    17. Coconut Negative

## 2023-01-29 ENCOUNTER — Encounter: Payer: Self-pay | Admitting: Allergy & Immunology

## 2023-01-29 LAB — CBC WITH DIFFERENTIAL/PLATELET
Basophils Absolute: 0 10*3/uL (ref 0.0–0.2)
Hemoglobin: 14.3 g/dL (ref 11.1–15.9)
Neutrophils: 62 %
Platelets: 230 10*3/uL (ref 150–450)
RDW: 12.2 % (ref 11.7–15.4)

## 2023-01-30 LAB — CBC WITH DIFFERENTIAL/PLATELET
EOS (ABSOLUTE): 0 10*3/uL (ref 0.0–0.4)
Immature Grans (Abs): 0 10*3/uL (ref 0.0–0.1)
Lymphs: 31 %
Monocytes Absolute: 0.5 10*3/uL (ref 0.1–0.9)
Neutrophils Absolute: 4.7 10*3/uL (ref 1.4–7.0)

## 2023-01-30 LAB — ANTINUCLEAR ANTIBODIES, IFA

## 2023-01-30 LAB — ALPHA-GAL PANEL

## 2023-01-30 LAB — C-REACTIVE PROTEIN: CRP: <1 mg/L (ref 0–10)

## 2023-02-01 LAB — CBC WITH DIFFERENTIAL/PLATELET
Eos: 0 %
MCH: 30.2 pg (ref 26.6–33.0)
MCV: 89 fL (ref 79–97)
Monocytes: 7 %

## 2023-02-01 LAB — ALPHA-GAL PANEL: Beef IgE: <0.1 kU/L

## 2023-02-02 LAB — CBC WITH DIFFERENTIAL/PLATELET
Basos: 0 %
Hematocrit: 42.1 % (ref 34.0–46.6)
Immature Granulocytes: 0 %
Lymphocytes Absolute: 2.4 10*3/uL (ref 0.7–3.1)
MCHC: 34 g/dL (ref 31.5–35.7)
RBC: 4.74 x10E6/uL (ref 3.77–5.28)
WBC: 7.6 10*3/uL (ref 3.4–10.8)

## 2023-02-02 LAB — TRYPTASE: Tryptase: 5.7 ug/L (ref 2.2–13.2)

## 2023-02-02 LAB — FANA STAINING PATTERNS: Speckled Pattern: 1:320 {titer} — ABNORMAL HIGH

## 2023-02-02 LAB — ALPHA-GAL PANEL
Allergen Lamb IgE: <0.1 kU/L
Pork IgE: <0.1 kU/L

## 2023-02-02 LAB — SEDIMENTATION RATE: Sed Rate: 2 mm/hr (ref 0–32)

## 2023-02-10 ENCOUNTER — Encounter: Payer: Self-pay | Admitting: Allergy & Immunology

## 2023-02-19 ENCOUNTER — Other Ambulatory Visit: Payer: Self-pay | Admitting: Otolaryngology

## 2023-02-19 ENCOUNTER — Encounter: Payer: Self-pay | Admitting: Otolaryngology

## 2023-02-19 DIAGNOSIS — E041 Nontoxic single thyroid nodule: Secondary | ICD-10-CM

## 2023-03-02 ENCOUNTER — Other Ambulatory Visit: Payer: Self-pay | Admitting: Otolaryngology

## 2023-03-02 ENCOUNTER — Inpatient Hospital Stay
Admission: RE | Admit: 2023-03-02 | Discharge: 2023-03-02 | Disposition: A | Discharge status: Home / Self Care | Payer: Self-pay | Source: Ambulatory Visit | Attending: Otolaryngology | Admitting: Otolaryngology

## 2023-03-02 DIAGNOSIS — E041 Nontoxic single thyroid nodule: Secondary | ICD-10-CM

## 2023-03-09 ENCOUNTER — Ambulatory Visit (HOSPITAL_COMMUNITY)
Admission: RE | Admit: 2023-03-09 | Discharge: 2023-03-09 | Disposition: A | Discharge status: Home / Self Care | Payer: BC Managed Care – PPO | Source: Ambulatory Visit | Attending: Otolaryngology | Admitting: Otolaryngology

## 2023-03-09 ENCOUNTER — Encounter (HOSPITAL_COMMUNITY): Payer: Self-pay

## 2023-03-09 DIAGNOSIS — R896 Abnormal cytological findings in specimens from other organs, systems and tissues: Secondary | ICD-10-CM | POA: Diagnosis not present

## 2023-03-09 DIAGNOSIS — E041 Nontoxic single thyroid nodule: Secondary | ICD-10-CM | POA: Insufficient documentation

## 2023-03-09 MED ORDER — LIDOCAINE HCL (PF) 2 % IJ SOLN
INTRAMUSCULAR | Status: AC
  Filled 2023-03-09: qty 10

## 2023-03-09 MED ORDER — LIDOCAINE HCL (PF) 2 % IJ SOLN
10.0000 mL | Freq: Once | INTRAMUSCULAR | Status: AC
  Administered 2023-03-09: 10 mL

## 2023-03-09 NOTE — Progress Notes (Signed)
PT tolerated thyroid biopsy procedure well today. Labs and afirma obtained and sent for pathology. PT ambulatory at discharge with no acute distress noted and verbalized understanding of discharge instructions. 

## 2023-03-24 ENCOUNTER — Encounter (HOSPITAL_COMMUNITY): Payer: Self-pay

## 2023-05-29 ENCOUNTER — Other Ambulatory Visit: Payer: Self-pay | Admitting: Medical Genetics

## 2023-05-29 DIAGNOSIS — Z006 Encounter for examination for normal comparison and control in clinical research program: Secondary | ICD-10-CM

## 2023-06-02 ENCOUNTER — Other Ambulatory Visit (HOSPITAL_COMMUNITY)
Admission: RE | Admit: 2023-06-02 | Discharge: 2023-06-02 | Disposition: A | Discharge status: Home / Self Care | Payer: BC Managed Care – PPO | Source: Ambulatory Visit | Attending: Medical Genetics | Admitting: Medical Genetics

## 2023-06-02 DIAGNOSIS — Z006 Encounter for examination for normal comparison and control in clinical research program: Secondary | ICD-10-CM | POA: Insufficient documentation

## 2023-06-12 LAB — HELIX MOLECULAR SCREEN: Genetic Analysis Overall Interpretation: NEGATIVE

## 2023-06-12 LAB — GENECONNECT MOLECULAR SCREEN

## 2023-06-15 ENCOUNTER — Other Ambulatory Visit: Payer: Self-pay | Admitting: Otolaryngology

## 2023-07-13 NOTE — Pre-Procedure Instructions (Signed)
Surgical Instructions   Your procedure is scheduled on July 17, 2023. Report to [facility] Main Entrance "A" at 7:20 A.M., then check in with the Admitting office. Any questions or running late day of surgery: call [phone]  Questions prior to your surgery date: call [phone], Monday-Friday, 8am-4pm. If you experience any cold or flu symptoms such as cough, fever, chills, shortness of breath, etc. between now and your scheduled surgery, please notify us at the above number.     Remember:  Do not eat after midnight the night before your surgery   You may drink clear liquids until 6:20 AM the morning of your surgery.   Clear liquids allowed are: Water, Non-Citrus Juices (without pulp), Carbonated Beverages, Clear Tea (no milk, honey, etc.), Black Coffee Only (NO MILK, CREAM OR POWDERED CREAMER of any kind), and Gatorade.    Take these medicines the morning of surgery with A SIP OF WATER: escitalopram (LEXAPRO)    One week prior to surgery, STOP taking any Aspirin (unless otherwise instructed by your surgeon) Aleve, Naproxen, Ibuprofen, Motrin, Advil, Goody's, BC's, all herbal medications, fish oil, and non-prescription vitamins.                     Do NOT Smoke (Tobacco/Vaping) for 24 hours prior to your procedure.  If you use a CPAP at night, you may bring your mask/headgear for your overnight stay.   You will be asked to remove any contacts, glasses, piercing's, hearing aid's, dentures/partials prior to surgery. Please bring cases for these items if needed.    Patients discharged the day of surgery will not be allowed to drive home, and someone needs to stay with them for 24 hours.  SURGICAL WAITING ROOM VISITATION Patients may have no more than 2 support people in the waiting area - these visitors may rotate.   Pre-op nurse will coordinate an appropriate time for 1 ADULT support person, who may not rotate, to accompany patient in pre-op.  Children under the age of 7  must have an adult with them who is not the patient and must remain in the main waiting area with an adult.  If the patient needs to stay at the hospital during part of their recovery, the visitor guidelines for inpatient rooms apply.  Please refer to the [facility] website for the visitor guidelines for any additional information.   If you received a COVID test during your pre-op visit  it is requested that you wear a mask when out in public, stay away from anyone that may not be feeling well and notify your surgeon if you develop symptoms. If you have been in contact with anyone that has tested positive in the last 10 days please notify you surgeon.      Pre-operative CHG Bathing Instructions   You can play a key role in reducing the risk of infection after surgery. Your skin needs to be as free of germs as possible. You can reduce the number of germs on your skin by washing with CHG (chlorhexidine gluconate) soap before surgery. CHG is an antiseptic soap that kills germs and continues to kill germs even after washing.   DO NOT use if you have an allergy to chlorhexidine/CHG or antibacterial soaps. If your skin becomes reddened or irritated, stop using the CHG and notify one of our RNs at [phone].              TAKE A SHOWER THE NIGHT BEFORE SURGERY AND THE DAY  OF SURGERY    Please keep in mind the following:  DO NOT shave, including legs and underarms, 48 hours prior to surgery.   You may shave your face before/day of surgery.  Place clean sheets on your bed the night before surgery Use a clean washcloth (not used since being washed) for each shower. DO NOT sleep with pet's night before surgery.  CHG Shower Instructions:  Wash your face and private area with normal soap. If you choose to wash your hair, wash first with your normal shampoo.  After you use shampoo/soap, rinse your hair and body thoroughly to remove shampoo/soap residue.  Turn the water OFF and apply half the  bottle of CHG soap to a CLEAN washcloth.  Apply CHG soap ONLY FROM YOUR NECK DOWN TO YOUR TOES (washing for 3-5 minutes)  DO NOT use CHG soap on face, private areas, open wounds, or sores.  Pay special attention to the area where your surgery is being performed.  If you are having back surgery, having someone wash your back for you may be helpful. Wait 2 minutes after CHG soap is applied, then you may rinse off the CHG soap.  Pat dry with a clean towel  Put on clean pajamas    Additional instructions for the day of surgery: DO NOT APPLY any lotions, deodorants, cologne, or perfumes.   Do not wear jewelry or makeup Do not wear nail polish, gel polish, artificial nails, or any other type of covering on natural nails (fingers and toes) Do not bring valuables to the hospital. [facility] is not responsible for valuables/personal belongings. Put on clean/comfortable clothes.  Please brush your teeth.  Ask your nurse before applying any prescription medications to the skin.

## 2023-07-14 ENCOUNTER — Other Ambulatory Visit (HOSPITAL_COMMUNITY): Payer: BC Managed Care – PPO

## 2023-07-14 ENCOUNTER — Encounter (HOSPITAL_COMMUNITY)
Admission: RE | Admit: 2023-07-14 | Discharge: 2023-07-14 | Disposition: A | Discharge status: Home / Self Care | Payer: BC Managed Care – PPO | Source: Ambulatory Visit | Attending: Otolaryngology | Admitting: Otolaryngology

## 2023-07-14 ENCOUNTER — Other Ambulatory Visit: Payer: Self-pay

## 2023-07-14 ENCOUNTER — Encounter (HOSPITAL_COMMUNITY): Payer: Self-pay

## 2023-07-14 VITALS — BP 130/84 | HR 87 | Temp 98.9°F | Resp 16 | Ht 70.0 in | Wt 183.4 lb

## 2023-07-14 DIAGNOSIS — Z01818 Encounter for other preprocedural examination: Secondary | ICD-10-CM | POA: Diagnosis present

## 2023-07-14 DIAGNOSIS — Z01812 Encounter for preprocedural laboratory examination: Secondary | ICD-10-CM | POA: Diagnosis not present

## 2023-07-14 HISTORY — DX: Hypothyroidism, unspecified: E03.9

## 2023-07-14 LAB — CBC
HCT: 34.7 % — ABNORMAL LOW (ref 36.0–46.0)
Hemoglobin: 11.7 g/dL — ABNORMAL LOW (ref 12.0–15.0)
MCH: 30.2 pg (ref 26.0–34.0)
MCHC: 33.7 g/dL (ref 30.0–36.0)
MCV: 89.7 fL (ref 80.0–100.0)
Platelets: 279 10*3/uL (ref 150–400)
RBC: 3.87 MIL/uL (ref 3.87–5.11)
RDW: 13.2 % (ref 11.5–15.5)
WBC: 6.9 10*3/uL (ref 4.0–10.5)
nRBC: 0 % (ref 0.0–0.2)

## 2023-07-14 NOTE — Progress Notes (Signed)
PCP - Lynelle Smoke Cardiologist - denies  PPM/ICD - denies Device Orders - n/a Rep Notified - n/a  Chest x-ray - denies EKG - denies Stress Test - denies ECHO - denies Cardiac Cath - denies  Sleep Study - denies CPAP - n/a  Fasting Blood Sugar - n/a   Last dose of GLP1 agonist-  n/a GLP1 instructions: n/a  Blood Thinner Instructions: n/a Aspirin Instructions: n/a  ERAS Protcol - clears until 0620   COVID TEST- n/a   Anesthesia review: no  Patient denies shortness of breath, fever, cough and chest pain at PAT appointment   All instructions explained to the patient, with a verbal understanding of the material. Patient agrees to go over the instructions while at home for a better understanding. Patient also instructed to self quarantine after being tested for COVID-19. The opportunity to ask questions was provided.

## 2023-07-17 ENCOUNTER — Observation Stay (HOSPITAL_COMMUNITY)
Admission: RE | Admit: 2023-07-17 | Discharge: 2023-07-17 | Disposition: A | Discharge status: Home / Self Care | Payer: BC Managed Care – PPO | Source: Ambulatory Visit | Attending: Otolaryngology | Admitting: Otolaryngology

## 2023-07-17 ENCOUNTER — Ambulatory Visit (HOSPITAL_COMMUNITY): Payer: BC Managed Care – PPO

## 2023-07-17 ENCOUNTER — Encounter (HOSPITAL_COMMUNITY)
Admission: RE | Disposition: A | Discharge status: Home / Self Care | Payer: Self-pay | Source: Ambulatory Visit | Attending: Otolaryngology

## 2023-07-17 ENCOUNTER — Encounter (HOSPITAL_COMMUNITY): Payer: Self-pay | Admitting: Otolaryngology

## 2023-07-17 ENCOUNTER — Other Ambulatory Visit: Payer: Self-pay

## 2023-07-17 ENCOUNTER — Other Ambulatory Visit (HOSPITAL_COMMUNITY): Payer: Self-pay

## 2023-07-17 DIAGNOSIS — Z87891 Personal history of nicotine dependence: Secondary | ICD-10-CM | POA: Diagnosis not present

## 2023-07-17 DIAGNOSIS — R490 Dysphonia: Secondary | ICD-10-CM | POA: Diagnosis not present

## 2023-07-17 DIAGNOSIS — K219 Gastro-esophageal reflux disease without esophagitis: Secondary | ICD-10-CM | POA: Insufficient documentation

## 2023-07-17 DIAGNOSIS — E041 Nontoxic single thyroid nodule: Secondary | ICD-10-CM | POA: Diagnosis present

## 2023-07-17 DIAGNOSIS — E039 Hypothyroidism, unspecified: Secondary | ICD-10-CM | POA: Insufficient documentation

## 2023-07-17 DIAGNOSIS — Z79899 Other long term (current) drug therapy: Secondary | ICD-10-CM | POA: Diagnosis not present

## 2023-07-17 HISTORY — PX: THYROIDECTOMY: SHX17

## 2023-07-17 LAB — POCT PREGNANCY, URINE: Preg Test, Ur: NEGATIVE

## 2023-07-17 SURGERY — THYROIDECTOMY
Anesthesia: General | Site: Neck | Laterality: Right

## 2023-07-17 MED ORDER — FENTANYL CITRATE (PF) 100 MCG/2ML IJ SOLN: 25.0000 ug | INTRAMUSCULAR | Status: DC | PRN

## 2023-07-17 MED ORDER — EPHEDRINE SULFATE-NACL 50-0.9 MG/10ML-% IV SOSY
PREFILLED_SYRINGE | INTRAVENOUS | Status: DC | PRN
  Administered 2023-07-17: 5 mg via INTRAVENOUS
  Administered 2023-07-17: 15 mg via INTRAVENOUS
  Administered 2023-07-17: 5 mg via INTRAVENOUS

## 2023-07-17 MED ORDER — SUCCINYLCHOLINE CHLORIDE 200 MG/10ML IV SOSY
PREFILLED_SYRINGE | INTRAVENOUS | Status: DC | PRN
  Administered 2023-07-17: 140 mg via INTRAVENOUS

## 2023-07-17 MED ORDER — SUCCINYLCHOLINE CHLORIDE 200 MG/10ML IV SOSY
PREFILLED_SYRINGE | INTRAVENOUS | Status: AC
  Filled 2023-07-17: qty 10

## 2023-07-17 MED ORDER — PROPOFOL 10 MG/ML IV BOLUS
INTRAVENOUS | Status: DC | PRN
  Administered 2023-07-17: 150 mg via INTRAVENOUS
  Administered 2023-07-17 (×2): 50 mg via INTRAVENOUS

## 2023-07-17 MED ORDER — OXYCODONE HCL 5 MG/5ML PO SOLN
5.0000 mg | Freq: Once | ORAL | Status: DC | PRN
Start: 2023-07-17 — End: 2023-07-17

## 2023-07-17 MED ORDER — EPINEPHRINE HCL (NASAL) 0.1 % NA SOLN
NASAL | Status: AC
  Filled 2023-07-17: qty 60

## 2023-07-17 MED ORDER — PROPOFOL 10 MG/ML IV BOLUS
INTRAVENOUS | Status: AC
  Filled 2023-07-17: qty 20

## 2023-07-17 MED ORDER — ONDANSETRON HCL 4 MG/2ML IJ SOLN
INTRAMUSCULAR | Status: DC | PRN
  Administered 2023-07-17: 4 mg via INTRAVENOUS

## 2023-07-17 MED ORDER — PROMETHAZINE HCL 12.5 MG PO TABS: 12.5000 mg | ORAL_TABLET | Freq: Four times a day (QID) | ORAL | 0 refills | Status: AC | PRN

## 2023-07-17 MED ORDER — ALBUMIN HUMAN 5 % IV SOLN: INTRAVENOUS | Status: DC | PRN

## 2023-07-17 MED ORDER — EPINEPHRINE HCL (NASAL) 0.1 % NA SOLN
NASAL | Status: DC | PRN
  Administered 2023-07-17: 1 [drp] via NASAL

## 2023-07-17 MED ORDER — ONDANSETRON HCL 4 MG/2ML IJ SOLN: 4.0000 mg | INTRAMUSCULAR | Status: DC | PRN

## 2023-07-17 MED ORDER — 0.9 % SODIUM CHLORIDE (POUR BTL) OPTIME
TOPICAL | Status: DC | PRN
  Administered 2023-07-17: 1000 mL

## 2023-07-17 MED ORDER — HEMOSTATIC AGENTS (NO CHARGE) OPTIME
TOPICAL | Status: DC | PRN
  Administered 2023-07-17: 1 via TOPICAL

## 2023-07-17 MED ORDER — ORAL CARE MOUTH RINSE: 15.0000 mL | Freq: Once | OROMUCOSAL | Status: AC

## 2023-07-17 MED ORDER — CHLORHEXIDINE GLUCONATE 0.12 % MT SOLN
15.0000 mL | Freq: Once | OROMUCOSAL | Status: AC
  Administered 2023-07-17: 15 mL via OROMUCOSAL
  Filled 2023-07-17: qty 15

## 2023-07-17 MED ORDER — HYDROCODONE-ACETAMINOPHEN 5-325 MG PO TABS
1.0000 | ORAL_TABLET | Freq: Four times a day (QID) | ORAL | 0 refills | Status: AC | PRN
  Filled 2023-07-17: qty 20, 5d supply, fill #0

## 2023-07-17 MED ORDER — MIDAZOLAM HCL 2 MG/2ML IJ SOLN
INTRAMUSCULAR | Status: AC
  Filled 2023-07-17: qty 2

## 2023-07-17 MED ORDER — ACETAMINOPHEN 10 MG/ML IV SOLN: 1000.0000 mg | Freq: Once | INTRAVENOUS | Status: DC | PRN

## 2023-07-17 MED ORDER — OXYCODONE HCL 5 MG PO TABS: 5.0000 mg | ORAL_TABLET | Freq: Once | ORAL | Status: DC | PRN

## 2023-07-17 MED ORDER — LIDOCAINE-EPINEPHRINE 1 %-1:100000 IJ SOLN
INTRAMUSCULAR | Status: AC
  Filled 2023-07-17: qty 1

## 2023-07-17 MED ORDER — ACETAMINOPHEN 10 MG/ML IV SOLN
INTRAVENOUS | Status: AC
  Filled 2023-07-17: qty 100

## 2023-07-17 MED ORDER — ONDANSETRON HCL 4 MG PO TABS
4.0000 mg | ORAL_TABLET | ORAL | Status: DC | PRN
Start: 2023-07-17 — End: 2023-07-17

## 2023-07-17 MED ORDER — MIDAZOLAM HCL 2 MG/2ML IJ SOLN
INTRAMUSCULAR | Status: DC | PRN
  Administered 2023-07-17: 2 mg via INTRAVENOUS

## 2023-07-17 MED ORDER — DROPERIDOL 2.5 MG/ML IJ SOLN
INTRAMUSCULAR | Status: AC
  Filled 2023-07-17: qty 2

## 2023-07-17 MED ORDER — LIDOCAINE 2% (20 MG/ML) 5 ML SYRINGE
INTRAMUSCULAR | Status: AC
  Filled 2023-07-17: qty 5

## 2023-07-17 MED ORDER — ACETAMINOPHEN 500 MG PO TABS: 500.0000 mg | ORAL_TABLET | Freq: Four times a day (QID) | ORAL | Status: DC

## 2023-07-17 MED ORDER — PHENYLEPHRINE 80 MCG/ML (10ML) SYRINGE FOR IV PUSH (FOR BLOOD PRESSURE SUPPORT)
PREFILLED_SYRINGE | INTRAVENOUS | Status: DC | PRN
  Administered 2023-07-17: 160 ug via INTRAVENOUS
  Administered 2023-07-17: 80 ug via INTRAVENOUS
  Administered 2023-07-17: 320 ug via INTRAVENOUS

## 2023-07-17 MED ORDER — DROPERIDOL 2.5 MG/ML IJ SOLN
0.6250 mg | Freq: Once | INTRAMUSCULAR | Status: AC | PRN
  Administered 2023-07-17: 0.625 mg via INTRAVENOUS

## 2023-07-17 MED ORDER — ACETAMINOPHEN 325 MG PO TABS
325.0000 mg | ORAL_TABLET | ORAL | Status: DC | PRN
Start: 2023-07-17 — End: 2023-07-17

## 2023-07-17 MED ORDER — HYDROCODONE-ACETAMINOPHEN 5-325 MG PO TABS
1.0000 | ORAL_TABLET | ORAL | Status: DC | PRN
  Administered 2023-07-17: 2 via ORAL
  Filled 2023-07-17: qty 2

## 2023-07-17 MED ORDER — SODIUM CHLORIDE 0.9 % IV SOLN: INTRAVENOUS | Status: DC | PRN

## 2023-07-17 MED ORDER — POLYETHYLENE GLYCOL 3350 17 G PO PACK
17.0000 g | PACK | Freq: Every day | ORAL | Status: DC | PRN
Start: 2023-07-17 — End: 2023-07-17

## 2023-07-17 MED ORDER — ONDANSETRON HCL 4 MG/2ML IJ SOLN
INTRAMUSCULAR | Status: AC
  Filled 2023-07-17: qty 2

## 2023-07-17 MED ORDER — LIDOCAINE 2% (20 MG/ML) 5 ML SYRINGE
INTRAMUSCULAR | Status: DC | PRN
  Administered 2023-07-17 (×2): 40 mg via INTRAVENOUS

## 2023-07-17 MED ORDER — ACETAMINOPHEN 10 MG/ML IV SOLN
INTRAVENOUS | Status: DC | PRN
  Administered 2023-07-17: 1000 mg via INTRAVENOUS

## 2023-07-17 MED ORDER — STERILE WATER FOR IRRIGATION IR SOLN
Status: DC | PRN
  Administered 2023-07-17: 1000 mL

## 2023-07-17 MED ORDER — CEFAZOLIN SODIUM-DEXTROSE 2-3 GM-%(50ML) IV SOLR
INTRAVENOUS | Status: DC | PRN
  Administered 2023-07-17: 2 g via INTRAVENOUS

## 2023-07-17 MED ORDER — DEXAMETHASONE SODIUM PHOSPHATE 10 MG/ML IJ SOLN
INTRAMUSCULAR | Status: AC
  Filled 2023-07-17: qty 1

## 2023-07-17 MED ORDER — LACTATED RINGERS IV SOLN: INTRAVENOUS | Status: DC

## 2023-07-17 MED ORDER — PHENYLEPHRINE HCL-NACL 20-0.9 MG/250ML-% IV SOLN
INTRAVENOUS | Status: DC | PRN
  Administered 2023-07-17: 40 ug/min via INTRAVENOUS

## 2023-07-17 MED ORDER — ACETAMINOPHEN 160 MG/5ML PO SOLN: 325.0000 mg | ORAL | Status: DC | PRN

## 2023-07-17 MED ORDER — GLYCOPYRROLATE 0.2 MG/ML IJ SOLN
INTRAMUSCULAR | Status: DC | PRN
  Administered 2023-07-17: .2 mg via INTRAVENOUS

## 2023-07-17 MED ORDER — IBUPROFEN 800 MG PO TABS
800.0000 mg | ORAL_TABLET | Freq: Three times a day (TID) | ORAL | Status: DC
  Administered 2023-07-17: 800 mg via ORAL
  Filled 2023-07-17: qty 1

## 2023-07-17 MED ORDER — LIDOCAINE-EPINEPHRINE 1 %-1:100000 IJ SOLN
INTRAMUSCULAR | Status: DC | PRN
  Administered 2023-07-17: 10 mL

## 2023-07-17 MED ORDER — FENTANYL CITRATE (PF) 250 MCG/5ML IJ SOLN
INTRAMUSCULAR | Status: AC
  Filled 2023-07-17: qty 5

## 2023-07-17 MED ORDER — FENTANYL CITRATE (PF) 250 MCG/5ML IJ SOLN
INTRAMUSCULAR | Status: DC | PRN
  Administered 2023-07-17: 100 ug via INTRAVENOUS
  Administered 2023-07-17 (×3): 50 ug via INTRAVENOUS

## 2023-07-17 MED ORDER — DEXAMETHASONE SODIUM PHOSPHATE 10 MG/ML IJ SOLN
INTRAMUSCULAR | Status: DC | PRN
  Administered 2023-07-17: 10 mg via INTRAVENOUS

## 2023-07-17 SURGICAL SUPPLY — 43 items
BLADE SURG 15 STRL LF DISP TIS (BLADE) ×1 IMPLANT
CANISTER SUCT 3000ML PPV (MISCELLANEOUS) ×1 IMPLANT
CNTNR URN SCR LID CUP LEK RST (MISCELLANEOUS) IMPLANT
CORD BIPOLAR FORCEPS 12FT (ELECTRODE) ×1 IMPLANT
COVER SURGICAL LIGHT HANDLE (MISCELLANEOUS) ×1 IMPLANT
DERMABOND ADVANCED .7 DNX12 (GAUZE/BANDAGES/DRESSINGS) ×1 IMPLANT
DRAIN JACKSON RD 7FR 3/32 (WOUND CARE) IMPLANT
DRAIN JP 10F RND RADIO (DRAIN) IMPLANT
DRAPE HALF SHEET 40X57 (DRAPES) IMPLANT
DRAPE UTILITY XL STRL (DRAPES) ×1 IMPLANT
ELECT COATED BLADE 2.86 ST (ELECTRODE) ×1 IMPLANT
ELECT REM PT RETURN 9FT ADLT (ELECTROSURGICAL) ×1
ELECTRODE REM PT RTRN 9FT ADLT (ELECTROSURGICAL) ×1 IMPLANT
EVACUATOR SILICONE 100CC (DRAIN) IMPLANT
FORCEPS BIPOLAR SPETZLER 8 1.0 (NEUROSURGERY SUPPLIES) ×1 IMPLANT
GAUZE 4X4 16PLY ~~LOC~~+RFID DBL (SPONGE) ×1 IMPLANT
GLOVE BIO SURGEON STRL SZ 6.5 (GLOVE) ×1 IMPLANT
GOWN STRL REUS W/ TWL LRG LVL3 (GOWN DISPOSABLE) ×2 IMPLANT
HEMOSTAT ARISTA ABSORB 3G PWDR (HEMOSTASIS) IMPLANT
HEMOSTAT SURGICEL 2X14 (HEMOSTASIS) ×1 IMPLANT
KIT BASIN OR (CUSTOM PROCEDURE TRAY) ×1 IMPLANT
KIT TURNOVER KIT B (KITS) ×1 IMPLANT
NDL HYPO 25GX1X1/2 BEV (NEEDLE) ×1 IMPLANT
NEEDLE HYPO 25GX1X1/2 BEV (NEEDLE) ×1
NS IRRIG 1000ML POUR BTL (IV SOLUTION) ×1 IMPLANT
PAD ARMBOARD 7.5X6 YLW CONV (MISCELLANEOUS) ×2 IMPLANT
PATTIES SURGICAL .5 X.5 (GAUZE/BANDAGES/DRESSINGS) IMPLANT
PENCIL SMOKE EVACUATOR (MISCELLANEOUS) ×1 IMPLANT
POSITIONER HEAD DONUT 9IN (MISCELLANEOUS) IMPLANT
PROBE NERVBE PRASS .33 (MISCELLANEOUS) ×1 IMPLANT
SET WALTER ACTIVATION W/DRAPE (SET/KITS/TRAYS/PACK) ×1 IMPLANT
SHEARS HARMONIC 9CM CVD (BLADE) ×1 IMPLANT
SPONGE INTESTINAL PEANUT (DISPOSABLE) ×1 IMPLANT
STAPLER VISISTAT 35W (STAPLE) IMPLANT
STRIP CLOSURE SKIN 1/2X4 (GAUZE/BANDAGES/DRESSINGS) IMPLANT
SUT MNCRL AB 4-0 PS2 18 (SUTURE) ×1 IMPLANT
SUT SILK 3 0 PS 1 (SUTURE) IMPLANT
SUT SILK 3 0 REEL (SUTURE) ×1 IMPLANT
SUT SILK 3 0SH CR/8 30 (SUTURE) ×1 IMPLANT
SUT VIC AB 3-0 SH 27X BRD (SUTURE) ×1 IMPLANT
SUT VICRYL 4-0 PS2 18IN ABS (SUTURE) ×1 IMPLANT
TOWEL GREEN STERILE FF (TOWEL DISPOSABLE) ×1 IMPLANT
TRAY ENT MC OR (CUSTOM PROCEDURE TRAY) ×1 IMPLANT

## 2023-07-17 NOTE — Anesthesia Postprocedure Evaluation (Signed)
Anesthesia Post Note  Patient: Kathryn Walker  Procedure(s) Performed: RIGHT HEMI-THYROIDECTOMY (Right: Neck)     Patient location during evaluation: PACU Anesthesia Type: General Level of consciousness: awake and alert Pain management: pain level controlled Vital Signs Assessment: post-procedure vital signs reviewed and stable Respiratory status: spontaneous breathing, nonlabored ventilation, respiratory function stable and patient connected to nasal cannula oxygen Cardiovascular status: blood pressure returned to baseline and stable Postop Assessment: no apparent nausea or vomiting Anesthetic complications: no  No notable events documented.  Last Vitals:  Vitals:   07/17/23 1445 07/17/23 1500  BP: (!) 95/54 (!) 92/59  Pulse: 79 81  Resp: 14 15  Temp:    SpO2: 95% 96%    Last Pain:  Vitals:   07/17/23 1500  TempSrc:   PainSc: 0-No pain                 Shelton Silvas

## 2023-07-17 NOTE — Progress Notes (Signed)
Patient reports nausea that was unrelieved by zofran. Paged Skotnicki MD, prescriptions for phenergan to be sent to patient's pharmacy. Patient instructed to contact MD office if nausea/ vomiting persist or she is unable to eat or drink.   AVS reviewed, TOC meds provided, IV's removed, all questions answered. Husband to provide transportation

## 2023-07-17 NOTE — Transfer of Care (Signed)
Immediate Anesthesia Transfer of Care Note  Patient: Kathryn Walker  Procedure(s) Performed: RIGHT HEMI-THYROIDECTOMY (Right: Neck)  Patient Location: PACU  Anesthesia Type:General  Level of Consciousness: drowsy  Airway & Oxygen Therapy: Patient Spontanous Breathing  Post-op Assessment: Report given to RN and Post -op Vital signs reviewed and stable  Post vital signs: Reviewed and stable  Last Vitals:  Vitals Value Taken Time  BP 105/68 07/17/23 1330  Temp 36.6 C 07/17/23 1330  Pulse 89 07/17/23 1334  Resp 24 07/17/23 1334  SpO2 94 % 07/17/23 1334  Vitals shown include unfiled device data.  Last Pain:  Vitals:   07/17/23 1330  TempSrc:   PainSc: Asleep         Complications: No notable events documented.

## 2023-07-17 NOTE — H&P (Signed)
Kathryn Walker is an 37 y.o. female.    Chief Complaint:  Right thyroid nodule  HPI: Patient presents today for planned elective procedure.  She denies any interval change in history since office visit on 05/11/2023:  Kathryn Walker is Kathryn 37 y.o. female who presents as Kathryn return patient for preoperative visit ahead of right hemithyroidectomy. Patient has had Kathryn comprehensive workup, to include ultrasound, ultrasound-guided biopsy and subsequent Afirma testing, which was consistent with benign nodule of the right thyroid gland. Patient does not have any nodules in the left gland. No new symptoms since last visit in July. Patient continues to experience compressive symptoms when laying flat. 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 She does endorse frequent throat clearing, but denies symptoms of heartburn and indigestion.   Past Medical History:  Diagnosis Date   ADHD (attention deficit hyperactivity disorder)    Angio-edema    Hypothyroidism    Panic attacks    TMJ (dislocation of temporomandibular joint)    Urticaria     Past Surgical History:  Procedure Laterality Date   APPENDECTOMY     CHOLECYSTECTOMY N/Kathryn 03/23/2013   Procedure: LAPAROSCOPIC CHOLECYSTECTOMY;  Surgeon: Dalia Heading, MD;  Location: [facility];  Service: General;  Laterality: N/Kathryn;    No family history on file.  Social History:  reports that she quit smoking about 10 years ago. Her smoking use included cigarettes. She started smoking about 18 years ago. She has Kathryn 4 pack-year smoking history. She does not have any smokeless tobacco history on file. She reports that she does not drink alcohol and does not use drugs.  Allergies: No Known Allergies  Medications Prior to Admission  Medication Sig Dispense Refill   atomoxetine (STRATTERA) 60 MG capsule Take 60 mg by mouth daily.     escitalopram (LEXAPRO) 20 MG tablet Take 20 mg by mouth daily.     levonorgestrel (MIRENA, 52  MG,) 20 MCG/DAY IUD TO BE INSERTED ONE TIME BY PRESCRIBER. ROUTE INTRAUTERINE.     Multiple Vitamin (MULTIVITAMIN) tablet Take 1 tablet by mouth daily.      No results found for this or any previous visit (from the past 48 hours). No results found.  ROS: ROS  Blood pressure (!) 110/92, pulse 73, temperature 98.4 F (36.9 C), temperature source Oral, resp. rate 18, height 5\' 10"  (1.778 m), weight 83 kg, SpO2 100%.  PHYSICAL EXAM: Physical Exam Constitutional:      Appearance: Normal appearance.  Pulmonary:     Effort: Pulmonary effort is normal.  Neurological:     General: No focal deficit present.     Mental Status: She is alert.  Psychiatric:        Mood and Affect: Mood normal.     Studies Reviewed: Thyroid US reviewed, afirma testing results reviewed   Assessment/Plan Kathryn Walker is Kathryn 37 y.o. female with 4.3 cm TI-RADS 3 nodule of the right thyroid gland, negative for malignancy on recent Afirma testing. Patient continues to experience symptoms of compression in association with the thyroid nodule. She also notes symptoms of hoarseness, however laryngoscopy demonstrated normal true vocal fold mobility with mild reflux changes noted and no evidence of vocal fold nodules. -To OR today for right hemithyroidectomy. Risks, recovery were comprehensively reviewed. All questions were answered. The patient voiced understanding of all the risks and benefits of the above-specified treatment plan and then willingly consented to the surgery.     Kathryn Walker  Kathryn Walker 07/17/2023, 8:53 AM

## 2023-07-17 NOTE — Anesthesia Procedure Notes (Signed)
Procedure Name: Intubation Date/Time: 07/17/2023 10:45 AM  Performed by: Kayleen Memos, CRNAPre-anesthesia Checklist: Patient identified, Emergency Drugs available, Suction available and Patient being monitored Patient Re-evaluated:Patient Re-evaluated prior to induction Oxygen Delivery Method: Circle System Utilized Preoxygenation: Pre-oxygenation with 100% oxygen Induction Type: IV induction Ventilation: Mask ventilation without difficulty Laryngoscope Size: Glidescope and 3 Grade View: Grade I Tube type: Oral (NIM) Number of attempts: 1 Airway Equipment and Method: Rigid stylet and Video-laryngoscopy Placement Confirmation: ETT inserted through vocal cords under direct vision, positive ETCO2 and breath sounds checked- equal and bilateral Secured at: 22 cm Tube secured with: Tape Dental Injury: Teeth and Oropharynx as per pre-operative assessment

## 2023-07-17 NOTE — Discharge Summary (Signed)
Physician Discharge Summary  Patient ID: Kathryn Walker MRN: [id number] DOB/AGE: 10-01-1985 37 y.o.  Admit date: 07/17/2023 Discharge date: 07/17/2023  Admission Diagnoses:  Principal Problem:   Thyroid nodule   Discharge Diagnoses:  Same  Surgeries: Procedure(s): RIGHT HEMI-THYROIDECTOMY on 07/17/2023   Consultants: None  Discharged Condition: Improved  Hospital Course: Kathryn Walker is an 37 y.o. female who was admitted 07/17/2023 with a chief complaint of Thyroid nodule.  They were brought to the operating room on 07/17/2023 and underwent the above named procedures.  Patient was admitted for observation following surgery, and had an uneventful postoperative course.  She was deemed stable for discharge later that evening.  Recent vital signs:  Vitals:   07/17/23 1600 07/17/23 1715  BP: 107/74 100/62  Pulse: 86 77  Resp: 19 16  Temp: 97.9 F (36.6 C) 98.2 F (36.8 C)  SpO2: 97% 99%    Recent laboratory studies:  Results for orders placed or performed during the hospital encounter of 07/17/23  Pregnancy, urine POC   Collection Time: 07/17/23  9:14 AM  Result Value Ref Range   Preg Test, Ur NEGATIVE NEGATIVE    Discharge Medications:   Allergies as of 07/17/2023   No Known Allergies      Medication List     TAKE these medications    atomoxetine 60 MG capsule Commonly known as: STRATTERA Take 60 mg by mouth daily.   escitalopram 20 MG tablet Commonly known as: LEXAPRO Take 20 mg by mouth daily.   HYDROcodone-acetaminophen 5-325 MG tablet Commonly known as: NORCO/VICODIN Take 1 tablet by mouth every 6 (six) hours as needed for up to 5 days for moderate pain (pain score 4-6).   Mirena (52 MG) 20 MCG/DAY Iud Generic drug: levonorgestrel TO BE INSERTED ONE TIME BY PRESCRIBER. ROUTE INTRAUTERINE.   multivitamin tablet Take 1 tablet by mouth daily.   promethazine 12.5 MG tablet Commonly known as: PHENERGAN Take 1 tablet (12.5 mg total) by mouth  every 6 (six) hours as needed for up to 4 days for nausea or vomiting.        Diagnostic Studies: No results found.  Disposition: Discharge disposition: 01-Home or Self Care          Follow-up Information     Borghild Thaker A, DO Follow up on 08/06/2023.   Specialty: Otolaryngology Why: Follow up as scheduled on 08/05/2022 @ 8AM Contact information: [address] [place] [address] [place] [address] [phone]                  Signed: Skeet Latch Tristen Pennino 07/17/2023, 6:30 PM

## 2023-07-17 NOTE — Op Note (Signed)
OPERATIVE NOTE  Kathryn Walker Date/Time of Admission: 07/17/2023  8:07 AM  CSN: [id number];WGN:[id number] Attending Provider: Cheron Schaumann A, DO Room/Bed: MCPO/NONE DOB: Jul 05, 1986 Age: 37 y.o.   Pre-Op Diagnosis: Thyroid nodule; Hoarseness of voice ; Laryngopharyngeal reflux  Post-Op Diagnosis: Thyroid nodule; Hoarseness of voice ; Laryngopharyngeal reflux  Procedure: Procedure(s): RIGHT HEMI-THYROIDECTOMY  Anesthesia: General  Surgeon(s): Anasophia Pecor A Lawerance Matsuo, DO  Staff: Circulator: Rogers Seeds, RN Relief Circulator: Josefa Half, RN Relief Scrub: Josefa Half, RN Scrub Person: Carmela Rima Circulator Assistant: Jeronimo Greaves, RN RN First Assistant: Jeronimo Greaves, RN  Implants: * No implants in log *  Specimens: ID Type Source Tests Collected by Time Destination  1 : Right Thyroid with isthmus Tissue PATH ENT excision SURGICAL PATHOLOGY Tilia Faso A, DO 07/17/2023 1124     Complications: None  EBL: 15 ML  Condition: stable  Operative Findings:  1 of  the 2 right parathyroid gland were identified and were nerurovascularly intact. The right recurrent laryngeal nerve was intact to visual examination and electrical stimulation at 0.3 mA.  Description of Operation: The patient was identified in the preoperative holding area.  Informed written consent including risks, benefits, alternatives, and possible complications including bleeding and nerve injury were obtained.  Patient was then taken, under the care of anesthesia, to the operating suite.  Patient was placed in the supine position on the operating table and then general anesthesia was administered using a NIMS endotracheal tube per anesthesia's protocol.  Incision site on anterior neck was marked and infiltrated with 10cc Lidocaine 1% with 1:100,000 epinephrine.  A transverse incision, approximately 5cm in length was made within a skin crease with a 15 blade scalpel. The incision  was carried down through the platysma. Inferiorly and superiorly based subplatysmal flaps were raised to the level of the clavicle and thyroid cartilage, respectively.  Four 2-0 silk sutures were used to retract the skin flaps at each corner of the incision. The midline raphe of the strap muscles was then identified and separated with the Bovie electrocautery in anatomic midline. The strap muscles were then dissected from the right  thyroid capsule. The strap muscles were retracted laterally. The superior pole vasculature was then identified and surrounding tissue was dissected free using both sharp and blunt instrumentation.  The superior pole vasculature was then clamped, cut, and ligated using Harmonic Scalpel.  The inferior pole was then identified and surrounding tissue were dissected free using both sharp and blunt instrumentation.  The inferior pole vessels were then clamped, cut, and ligated using Harmonic Scalpel. The thyroid was then retracted medially.  The middle thyroid vein was identified and ligated.  The recurrent laryngeal was then identified in the tracheoesophageal groove.  The nerve integrity monitoring probe was then used to stimulate the recurrent laryngeal nerve to ensure its identification and integrity.  The nerve was carefully dissected superiorly until it entered the larynx.  The right   thyroid was removed from the anterior tracheal wall through Berry's Ligament. The thyroid lobe was then passed off the field to be evaluated by pathology as a permanent specimen.  Spot bipolar electrocautery was used to achieve meticulous hemostasis, and the dissected nerve was once again stimulated and found to be functional.  The wound bed was copiously irrigated and once again inspected for any bleeding.  There was none.  Arista was placed in the wound bed. The strap muscles were then reapproximated using a 3-0 Vicryl suture in a running, locking fashion. The incision  was then closed in a layered  fashion. The platysma was closed with interrupted 3-0 Vicryl sutures.  The subcutaneous tissue was then closed with buried interrupted 4-0 vicryl sutures.  The skin was then running 4-0 Monocryl closed with Dermabond.  Finally, Steristrips were placed over the incision. All counts were correct and the procedure was terminated. Patient was returned to the care of anesthesia and extubated without difficulty. She was transferred to the recovery room in stable condition.     Laren Boom, DO [facility]  07/17/2023

## 2023-07-17 NOTE — Discharge Instructions (Signed)
[facility] Post Operative Instructions Office Number [phone]  The Surgery Itself Thyroid surgery involves general anesthesia. Patients may be quite sedated for several hours after surgery and may remain sleepy for much of the day. Nausea and vomiting is occasionally seen, and usually resolves by the evening of surgery - even without additional medications. Some patients stay one night in the hospital and are discharged the next day. Other patients can go home the evening of surgery. Many patients will have a drain in place after surgery-this is removed the following day before you go home from the hospital or in the office 1-3 days after surgery.   Your Incision Your incision is closed with absorbable sutures and is covered with a small strip of tape or skin glue. You can shower and wash your hair as usual starting 24-48 hours after your drain is removed, as directed by your surgeon. If you did not have a drain in place after surgery, you may shower 24 hours after surgery. You may wash in a bathtub prior to that time if you are careful not to get your neck wet. Do not soak or scrub the incision. You might notice bruising around your incision or upper chest and slight swelling above the scar when you are upright. In addition, the scar may become pink and hard. This hardening will peak at about 3 weeks and may result in some tightness or difficulty swallowing, which will disappear over the next 2 to 3 months. You should apply sunscreen on your incision once directed by your surgeon (usually starting one month  after surgery) EVERY day for the first year after surgery. This will prevent a red or pink scar and give you the best cosmetic result for your scar. A daily moisturizer with sunscreen (example Oil of Olay with SPF 15) is fine.   Limitations You can start resuming normal activities as tolerated 7 days after surgery. For some patients, lifting can cause pain and stretching  at the surgery site for up to 2-3 weeks after surgery. You should not drive or drink alcohol while taking pain medications. Most people can return to work/school in 1-2 weeks after surgery, but there may be physical limitations as far as what you may do while at work.   Medications ? Pain medication should be used for pain as prescribed. Pain is expected after surgery. Your neck will be sore and pain will be worse when the neck is stretched and when you swallow. As the surgical site heals, pain will resolve over the course of a week. It is not uncommon for pain to get worse when you first go home because your activity may increase, but from that point on the pain should improve every day. Pain medications can cause nausea, which can be prevented if you take them with food or milk. ? You may be given a stool softener (Colace) because pain medications may make you  constipated. You can also use an over the counter stool softener.  ? You may be given Bacitracin ointment. If you are, it should be applied to the drain exit site three times a day for 2 days after the drain is removed. It should also be applied to the drain site before and after your first shower after surgery. It is normal to have some red or pink drainage from your drain exit site for 1-2 days after it is removed. ? If you were taking thyroid hormone tablets before your operation, you will continue   this after surgery, but sometimes your surgeon will change the dose. If you were not taking thyroid hormone prior to your operation, your surgeon may prescribe these tablets following surgery if the entire thyroid is removed. During your post-operative visits, you may have a blood test to measure your levels of thyroid hormone and your dose of medication may be adjusted accordingly. ? If you had parathyroid surgery or a total thyroidectomy, you may be instructed to take extra calcium supplements until your blood calcium levels stabilize. These usually  have to be purchased "over the counter" at a drug store and your surgeon will give you specific instructions. Generic brands are fine. Calcium carbonate with Vitamin D or TUMS are usually recommended. If you take any medications for gastric reflux (heartburn), you may be instructed to take Calcium Citrate with vitamin D instead of the other types of calcium. Your surgeon will instruct you on which type of calcium supplement to purchase and how many tablets to take each day after surgery. ? Take all of your routine medications as prescribed, unless told otherwise by your surgeon. Any medications which thin the blood should be avoided until your surgeon tells you to resume them.  ? IT IS OK TO TAKE OVER THE COUNTER PAIN MEDICATION (IBUPROFEN, NAPROXEN, or ACETAMINOPHEN) IN ADDITION TO YOUR PRESCRIBED MEDICATIONS. DO NOT TAKE ASPIRIN UNLESS CLEARED WITH YOUR SURGEON.  ? Limit Acetaminophen/Tylenol to less than 4,000mg/day  ? Limit Ibuprofen/Motrin to less than 3,600mg/day  Pain The main complaint following thyroid surgery is pain with swallowing and neck movement. Some people experience a dull ache, while others feel a sharp pain. This should not keep you from eating anything you want or moving your neck and will improve daily after surgery. It is normal to have a sore throat as well.   Voice Your voice may go through some temporary changes with fluctuations in volume and clarity (hoarseness). Generally, it will be better in the mornings and "tire" toward the end of the day. This can last for variable periods of time, but should clear in 8-10 weeks at most.   Cough You may feel like you have phlegm in your throat or a sore throat. This is usually because there was a tube in your windpipe while you were asleep that caused irritation that you perceive as phlegm. You will notice that if you cough, very little phlegm will come up. This should clear up in 4 to 5 days.  Hypocalcemia (Low blood calcium) In  some patients who have thyroid and parathyroid surgery, the parathyroid glands do not function properly immediately after surgery. This is usually temporary and causes the blood calcium level to drop below normal (hypocalcemia). Symptoms of hypocalcemia include numbness and tingling of your lips (like they fell asleep), in your hands and in your feet. Some patients experience a "crawling" sensation in the skin, muscle cramps or headaches. These symptoms can appear between 24 and 72 hours after surgery. It is rare for them to start more than 72 hours after surgery. If this happens, take 2 extra calcium tablets. If the symptoms do not resolve within one hour, you should call your doctor. If this happens during the business day, call the nurse triage line. If this happens in the evening or over the weekend, call the on call physician or go to the ER so your blood calcium levels can be checked. You can take extra calcium supplements (which will help to resolve symptoms) as you are contacting the clinic or   coming to the ER. Taking extra calcium supplements if you do not need them will not cause you any harm.  Reasons to call your surgeon's office ? Persistent fever over 101 F ? Increasing neck swelling ? Numbness or tingling around your mouth or fingertips  ? Pain that is not relieved by your medications ? Purulent drainage (pus) from the incision ? Redness surrounding the incision that is worsening or getting bigger ? Bleeding is possible after surgery and the most serious cases may cause trouble breathing. Symptoms include rapid swelling in the neck, trouble breathing, red and purple discoloration of the skin over the incision. If breathing difficulty occurs with this rapid neck swelling, call the doctor immediately, go to the closest emergency room or call 911.  

## 2023-07-17 NOTE — Anesthesia Preprocedure Evaluation (Signed)
Anesthesia Evaluation  Patient identified by MRN, date of birth, ID band Patient awake    Reviewed: Allergy & Precautions, NPO status , Patient's Chart, lab work & pertinent test results  Airway Mallampati: I  TM Distance: >3 FB Neck ROM: Full    Dental  (+) Teeth Intact, Dental Advisory Given   Pulmonary former smoker   breath sounds clear to auscultation       Cardiovascular negative cardio ROS  Rhythm:Regular Rate:Normal     Neuro/Psych  PSYCHIATRIC DISORDERS Anxiety        GI/Hepatic negative GI ROS, Neg liver ROS,,,  Endo/Other  Hypothyroidism    Renal/GU negative Renal ROS     Musculoskeletal negative musculoskeletal ROS (+)    Abdominal   Peds  Hematology negative hematology ROS (+)   Anesthesia Other Findings   Reproductive/Obstetrics                             Anesthesia Physical Anesthesia Plan  ASA: 2  Anesthesia Plan: General   Post-op Pain Management: Tylenol PO (pre-op)* and Toradol IV (intra-op)*   Induction: Intravenous  PONV Risk Score and Plan: 4 or greater and Ondansetron, Dexamethasone, Midazolam and Scopolamine patch - Pre-op  Airway Management Planned: Oral ETT  Additional Equipment: None  Intra-op Plan:   Post-operative Plan: Extubation in OR  Informed Consent: I have reviewed the patients History and Physical, chart, labs and discussed the procedure including the risks, benefits and alternatives for the proposed anesthesia with the patient or authorized representative who has indicated his/her understanding and acceptance.     Dental advisory given  Plan Discussed with: CRNA  Anesthesia Plan Comments:        Anesthesia Quick Evaluation

## 2023-07-18 ENCOUNTER — Encounter (HOSPITAL_COMMUNITY): Payer: Self-pay | Admitting: Otolaryngology

## 2023-07-21 LAB — SURGICAL PATHOLOGY

## 2023-07-31 ENCOUNTER — Encounter: Payer: Self-pay | Admitting: Family Medicine

## 2023-07-31 ENCOUNTER — Other Ambulatory Visit: Payer: Self-pay

## 2023-07-31 ENCOUNTER — Ambulatory Visit: Payer: 59 | Admitting: Family Medicine

## 2023-07-31 VITALS — BP 118/80 | HR 99 | Temp 98.6°F | Resp 16 | Ht 68.0 in | Wt 186.4 lb

## 2023-07-31 DIAGNOSIS — L508 Other urticaria: Secondary | ICD-10-CM | POA: Insufficient documentation

## 2023-07-31 NOTE — Progress Notes (Signed)
 [address] [place] [address] Dept: [phone]  FOLLOW UP NOTE  Patient ID: Kathryn Walker, female    DOB: 17-Sep-1985  Age: 38 y.o. MRN: [id number] Date of Office Visit: 07/31/2023  Assessment  Chief Complaint: Allergic Rhinitis  (Congestion with cough - has an urgent care appointment today at 9:45 - had her right thyroid  removed 07/17/23. ) and Urticaria (No hives or swelling )  HPI Kathryn Walker is a 38 year old female who presents to the clinic for follow-up visit.  She was last seen in this clinic on 01/28/2023 by Dr. Iva as a new patient for evaluation of urticaria.  Her last skin testing was on 01/28/2023 and was negative to the environmental panel.  She had an urticaria workup which was essentially normal.  On 07/17/2023 she had a right Hemi thyroidectomy with her ENT specialist, Dr. Llewellyn. She reports that the pressure on her trachea has been greatly reduced at this time.   At today's visit, she reports that she has not had any urticarial episodes since her last visit to this clinic.  She is not currently taking any antihistamines.  She reports that she began to experience nasal symptoms including rhinorrhea and nasal congestion about 2 days ago.  She reports that she has an urgent care visit this morning at 945 to address these issues.  Her current medications are listed in the chart.  Drug Allergies:  No Known Allergies  Physical Exam: BP 118/80 (Cuff Size: Normal)   Pulse 99   Temp 98.6 F (37 C)   Resp 16   Ht 5' 8 (1.727 m)   Wt 186 lb 6.4 oz (84.6 kg)   SpO2 98%   BMI 28.34 kg/m    Physical Exam Vitals reviewed.  Constitutional:      Appearance: Normal appearance.  HENT:     Head: Normocephalic and atraumatic.     Right Ear: Tympanic membrane normal.     Left Ear: Tympanic membrane normal.     Nose:     Comments: Bilateral naris normal.  Septal deviation noted.  Pharynx normal.  Left ear with clear effusion.  Right ear  normal.  Eyes normal.    Mouth/Throat:     Pharynx: Oropharynx is clear.  Eyes:     Conjunctiva/sclera: Conjunctivae normal.  Cardiovascular:     Rate and Rhythm: Normal rate and regular rhythm.     Heart sounds: Normal heart sounds. No murmur heard. Pulmonary:     Effort: Pulmonary effort is normal.     Breath sounds: Normal breath sounds.     Comments: Lungs clear to auscultation Musculoskeletal:        General: Normal range of motion.     Cervical back: Normal range of motion and neck supple.  Skin:    General: Skin is warm and dry.     Comments: Surgical site with dressing in place.  Neurological:     Mental Status: She is alert and oriented to person, place, and time.  Psychiatric:        Mood and Affect: Mood normal.        Behavior: Behavior normal.        Thought Content: Thought content normal.        Judgment: Judgment normal.     Assessment and Plan: 1. Acute urticaria     Patient Instructions  Chronic urticaria If your symptoms re-occur, begin a journal of events that occurred for up to 6 hours before your  symptoms began including foods and beverages consumed, soaps or perfumes you had contact with, and medications.   Call the clinic if this treatment plan is not working well for you.    Follow up in 1 year or sooner if needed.    Return in about 1 year (around 07/30/2024), or if symptoms worsen or fail to improve.    Thank you for the opportunity to care for this patient.  Please do not hesitate to contact me with questions.  Arlean Mutter, FNP [facility] 

## 2023-07-31 NOTE — Patient Instructions (Signed)
 Chronic urticaria If your symptoms re-occur, begin a journal of events that occurred for up to 6 hours before your symptoms began including foods and beverages consumed, soaps or perfumes you had contact with, and medications.   Call the clinic if this treatment plan is not working well for you.    Follow up in 1 year or sooner if needed.

## 2024-07-29 ENCOUNTER — Encounter: Payer: Self-pay | Admitting: Allergy & Immunology

## 2024-07-29 ENCOUNTER — Ambulatory Visit: Payer: 59 | Admitting: Allergy & Immunology

## 2024-07-29 VITALS — BP 120/88 | HR 74 | Temp 98.0°F | Resp 16 | Ht 69.5 in | Wt 197.2 lb

## 2024-07-29 DIAGNOSIS — L508 Other urticaria: Secondary | ICD-10-CM | POA: Diagnosis not present

## 2024-07-29 NOTE — Progress Notes (Signed)
 "  FOLLOW UP  Date of Service/Encounter:  07/29/2024   Assessment:   Acute urticaria - with non reactive skin testing in the past    Fourth grade teacher at [employer]  Plan/Recommendations:   1. Acute urticaria - with particular triggering from Japanese food (likely MSG) - There is no need for further testing at this point.  - I do not think that we need to see you on the regular, unless you need us  again.  2. Return if symptoms worsen or fail to improve. You can have the follow up appointment with Dr. Iva or a Nurse Practicioner (our Nurse Practitioners are excellent and always have Physician oversight!).  Subjective:   Kathryn Walker is a 39 y.o. female presenting today for follow up of  Chief Complaint  Patient presents with   Follow-up    Kathryn Walker has a history of the following: Patient Active Problem List   Diagnosis Date Noted   Acute urticaria 07/31/2023   Thyroid  nodule 07/17/2023   Rh negative state in antepartum period, third trimester 10/05/2017   Encounter for antenatal screening of mother 06/23/2017   Right hand pain 07/09/2015    History obtained from: chart review and patient.  Discussed the use of AI scribe software for clinical note transcription with the patient and/or guardian, who gave verbal consent to proceed.  Kathryn Walker is a 39 y.o. female presenting for a follow up visit. She was last seen in January 2025. At that time, we continued with antihistamines as needed.   Since last visit, she has done very well.  Hives have largely resolved.  We never did figure out a trigger, but her symptoms have gotten a lot better.  She does remember the last time she had a flare.  She had parathyroid surgery in December 2024 and she had a nodule removed. She had her thyroid  removed on the right. Levels have been stable. She has never needed placement hormone medication. She is on replacement iron and B3.   School is keeping her busy.  She is  now doing fourth grade which she likes quite a bit more.  Otherwise, there have been no changes to her past medical history, surgical history, family history, or social history.    Review of systems otherwise negative other than that mentioned in the HPI.    Objective:   Blood pressure 120/88, pulse 74, temperature 98 F (36.7 C), resp. rate 16, height 5' 9.5 (1.765 m), weight 197 lb 4 oz (89.5 kg), SpO2 97%. Body mass index is 28.71 kg/m.    Physical Exam Constitutional:      Appearance: She is well-developed.  HENT:     Head: Normocephalic and atraumatic.     Right Ear: Tympanic membrane, ear canal and external ear normal. No drainage, swelling or tenderness. Tympanic membrane is not injected, scarred, erythematous, retracted or bulging.     Left Ear: Tympanic membrane, ear canal and external ear normal. No drainage, swelling or tenderness. Tympanic membrane is not injected, scarred, erythematous, retracted or bulging.     Nose: No nasal deformity, septal deviation, mucosal edema or rhinorrhea.     Right Sinus: No maxillary sinus tenderness or frontal sinus tenderness.     Left Sinus: No maxillary sinus tenderness or frontal sinus tenderness.     Mouth/Throat:     Mouth: Mucous membranes are not pale and not dry.     Pharynx: Uvula midline.  Eyes:     General:  Right eye: No discharge.        Left eye: No discharge.     Conjunctiva/sclera: Conjunctivae normal.     Right eye: Right conjunctiva is not injected. No chemosis.    Left eye: Left conjunctiva is not injected. No chemosis.    Pupils: Pupils are equal, round, and reactive to light.  Cardiovascular:     Rate and Rhythm: Normal rate and regular rhythm.     Heart sounds: Normal heart sounds.  Pulmonary:     Effort: Pulmonary effort is normal. No tachypnea, accessory muscle usage or respiratory distress.     Breath sounds: Normal breath sounds. No wheezing, rhonchi or rales.  Chest:     Chest wall: No  tenderness.  Lymphadenopathy:     Head:     Right side of head: No submandibular, tonsillar or occipital adenopathy.     Left side of head: No submandibular, tonsillar or occipital adenopathy.     Cervical: No cervical adenopathy.  Skin:    Coloration: Skin is not pale.     Findings: No abrasion, erythema, petechiae or rash. Rash is not papular, urticarial or vesicular.  Neurological:     Mental Status: She is alert.      Diagnostic studies: none      Marty Shaggy, MD  [facility]        "

## 2024-07-29 NOTE — Patient Instructions (Addendum)
 1. Acute urticaria - with particular triggering from Japanese food (likely MSG) - There is no need for further testing at this point.  - I do not think that we need to see you on the regular, unless you need us  again. - You seem to know what to avoid and whatnot.   2. Return if symptoms worsen or fail to improve. You can have the follow up appointment with Dr. Iva or a Nurse Practicioner (our Nurse Practitioners are excellent and always have Physician oversight!).   Please inform us  of any Emergency Department visits, hospitalizations, or changes in symptoms. Call us  before going to the ED for breathing or allergy  symptoms since we might be able to fit you in for a sick visit. Feel free to contact us  anytime with any questions, problems, or concerns.  It was a pleasure to see you again today!  Websites that have reliable patient information: 1. American Academy of Asthma, Allergy , and Immunology: www.aaaai.org 2. Food Allergy  Research and Education (FARE): foodallergy.org 3. Mothers of Asthmatics: http://www.asthmacommunitynetwork.org 4. [employer] of Allergy , Asthma, and Immunology: www.acaai.org      Like us  on [employer] and Instagram for our latest updates!      A healthy democracy works best when [employer] participate! Make sure you are registered to vote! If you have moved or changed any of your contact information, you will need to get this updated before voting! Scan the QR codes below to learn more!
